# Patient Record
Sex: Female | Born: 1965
Health system: Southern US, Community
[De-identification: ages and names within clinical notes are randomized; demographics above are authoritative.]

## PROBLEM LIST (undated history)

## (undated) DIAGNOSIS — Z87442 Personal history of urinary calculi: Secondary | ICD-10-CM

## (undated) HISTORY — PX: CYSTECTOMY: SUR359

## (undated) HISTORY — PX: ABDOMINAL HYSTERECTOMY: SHX81

## (undated) HISTORY — PX: TUBAL LIGATION: SHX77

## (undated) HISTORY — PX: ANTERIOR CRUCIATE LIGAMENT REPAIR: SHX115

---

## 2001-02-09 ENCOUNTER — Emergency Department (HOSPITAL_COMMUNITY): Admission: EM | Admit: 2001-02-09 | Discharge: 2001-02-09 | Payer: Self-pay | Admitting: Emergency Medicine

## 2001-02-09 ENCOUNTER — Encounter: Payer: Self-pay | Admitting: Emergency Medicine

## 2001-02-10 ENCOUNTER — Encounter: Payer: Self-pay | Admitting: Family Medicine

## 2001-02-10 ENCOUNTER — Ambulatory Visit (HOSPITAL_COMMUNITY): Admission: RE | Admit: 2001-02-10 | Discharge: 2001-02-10 | Payer: Self-pay | Admitting: Family Medicine

## 2001-07-01 ENCOUNTER — Observation Stay (HOSPITAL_COMMUNITY): Admission: RE | Admit: 2001-07-01 | Discharge: 2001-07-02 | Payer: Self-pay | Admitting: Urology

## 2009-07-05 ENCOUNTER — Emergency Department (HOSPITAL_COMMUNITY): Admission: EM | Admit: 2009-07-05 | Discharge: 2009-07-05 | Payer: Self-pay | Admitting: Emergency Medicine

## 2009-07-06 ENCOUNTER — Emergency Department (HOSPITAL_COMMUNITY): Admission: EM | Admit: 2009-07-06 | Discharge: 2009-07-06 | Payer: Self-pay | Admitting: Emergency Medicine

## 2010-12-06 LAB — CBC
HCT: 30.4 % — ABNORMAL LOW (ref 36.0–46.0)
Hemoglobin: 10.4 g/dL — ABNORMAL LOW (ref 12.0–15.0)
MCHC: 34.3 g/dL (ref 30.0–36.0)
MCV: 101.3 fL — ABNORMAL HIGH (ref 78.0–100.0)
Platelets: 253 10*3/uL (ref 150–400)
RBC: 2.99 MIL/uL — ABNORMAL LOW (ref 3.87–5.11)
RDW: 14.7 % (ref 11.5–15.5)
WBC: 12.3 10*3/uL — ABNORMAL HIGH (ref 4.0–10.5)

## 2010-12-06 LAB — COMPREHENSIVE METABOLIC PANEL
ALT: 15 U/L (ref 0–35)
AST: 15 U/L (ref 0–37)
Albumin: 3.2 g/dL — ABNORMAL LOW (ref 3.5–5.2)
Alkaline Phosphatase: 46 U/L (ref 39–117)
BUN: 23 mg/dL (ref 6–23)
CO2: 24 mEq/L (ref 19–32)
Calcium: 8.1 mg/dL — ABNORMAL LOW (ref 8.4–10.5)
Chloride: 110 mEq/L (ref 96–112)
Creatinine, Ser: 0.49 mg/dL (ref 0.4–1.2)
GFR calc Af Amer: >60 mL/min (ref >60)
GFR calc non Af Amer: >60 mL/min (ref >60)
Glucose, Bld: 103 mg/dL — ABNORMAL HIGH (ref 70–99)
Potassium: 3.8 mEq/L (ref 3.5–5.1)
Sodium: 138 mEq/L (ref 135–145)
Total Bilirubin: 0.2 mg/dL — ABNORMAL LOW (ref 0.3–1.2)
Total Protein: 5.2 g/dL — ABNORMAL LOW (ref 6.0–8.3)

## 2010-12-06 LAB — DIFFERENTIAL
Basophils Absolute: 0 10*3/uL (ref 0.0–0.1)
Basophils Relative: 0 % (ref 0–1)
Eosinophils Absolute: 0.2 10*3/uL (ref 0.0–0.7)
Eosinophils Relative: 2 % (ref 0–5)
Lymphocytes Relative: 9 % — ABNORMAL LOW (ref 12–46)
Lymphs Abs: 1.1 10*3/uL (ref 0.7–4.0)
Monocytes Absolute: 0.4 10*3/uL (ref 0.1–1.0)
Monocytes Relative: 3 % (ref 3–12)
Neutro Abs: 10.5 10*3/uL — ABNORMAL HIGH (ref 1.7–7.7)
Neutrophils Relative %: 85 % — ABNORMAL HIGH (ref 43–77)

## 2010-12-06 LAB — HEMOGLOBIN AND HEMATOCRIT, BLOOD
HCT: 36.7 % (ref 36.0–46.0)
Hemoglobin: 12.3 g/dL (ref 12.0–15.0)

## 2010-12-06 LAB — PROTIME-INR: Prothrombin Time: 13.4 seconds (ref 11.6–15.2)

## 2010-12-06 LAB — APTT: aPTT: 27 seconds (ref 24–37)

## 2011-01-19 NOTE — Op Note (Signed)
[facility]  Patient:    Christine Tate, Christine Tate Visit Number: [id number] MRN: [id number]          Service Type: OBV Location: 3A A308 01 Attending Physician:  Alleen Borne I Dictated by:   Alleen Borne, M.D. Proc. Date: 07/01/01 Admit Date:  07/01/2001 Discharge Date: 07/02/2001   CC:         Dr. Reuel Boom   Operative Report  PREOPERATIVE DIAGNOSES: 1. ______ injury. 2. Incontinence.  POSTOPERATIVE DIAGNOSES: 1. ______ injury. 2. Incontinence.  PROCEDURE:  Tension free vaginal tear.  ANESTHESIA:  General endotracheal.  SURGEON:  Alleen Borne, M.D.  PROCEDURE IN DETAIL:  The patient was given general endotracheal anesthesia, and placed in lithotomy position.  Usual prep and drape.  A number 20 Foley catheter is introduced into the bladder and the site of the suprapubic area at the level of the pubic tubercle on the skin is marked on both sides of the midline.  Then 10 cc. of normal saline is injected into the periurethral area through hydrodissection between the vaginal wall and the perirectal tissue. An incision about a 1/2 inch long is made base the urethra ending about 1.5 cm proximal to the urethral meatus.  Once this incision is made with scissor, blunt dissection is done in the periurethral area just enough to accommodate the tip of the trocar.  At this point the Foley catheter is removed and the Foley catheter with a trocar is introduced into the bladder and the catheter is deflected towards the right side so that I can insert the trocar on the left side of the mid urethra.  The trocar with attached blue tubing is passed at the level of the mid urethra pushed into the retropubic space.  The back of the pubic symphysis is felt and the trocar is pushed into the suprapubic area at the marked site. A small skin incision is made, the trocar is pulled out through it along with the blue tubing.  Now the catheter is removed and cystoscopy  is performed with a right angle lens.  The bladder is thoroughly inspected.  There is no foreign body in the bladder.  The tubing is outside the bladder.  I proceeded to insert the other end on the right side in the same exact fashion and further cystoscopy was performed to make sure the tubing is outside the bladder which it was.  I filled up the bladder with about 300 cc. of saline to make sure bladder is fully distended to make sure the tubing is outside the bladder.  Now the tubing is passing from left to right side going under the urethra.  A number 20 Foley catheter is introduced into the bladder and special tension free vaginal tape is attached to the left end of the blue tubing and is pushed into the vaginal incision and then followed to the other end so this end came to the right side.  The tape was resting against the urethra and there was a______ on the tape at that point.  Once the tension on the tape is adjusted I had a curved Mayo scissor in between the urethra and the tape and after making sure there is proper tension on the tape, the plastic sleeve was removed.  The scissor was removed.  The tape is lying gently against the urethra.  The redundant tape was simply cut in the suprapubic area.  The wound is irrigated and then it is closed with interrupted sutures of 3-0 Vicryl.  There is no bleeding going on.  The bladder was filled up with 200 cc. of saline and Foley catheter was removed.  I left a small dry packing in the vagina to check for any bleeding and all the instruments were removed. The patient left the operating room in satisfactory condition. Dictated by:   Alleen Borne, M.D. Attending Physician:  Alleen Borne I DD:  07/01/01 TD:  07/01/01 Job: [id number]

## 2011-01-19 NOTE — H&P (Signed)
[facility]  Patient:    Christine Tate, CASASOLA Visit Number: [id number] MRN: [id number]          Service Type: DSU Location: DAY Attending Physician:  Alleen Borne I Dictated by:   Alleen Borne, M.D. Admit Date:  07/01/2001                           History and Physical  CHIEF COMPLAINT: Stress urinary incontinence.  HISTORY OF PRESENT ILLNESS: This is a 45 year old female, referred to me by Dr. Garner Nash.  She is having stress urinary incontinence, which has gotten gradually worse over the last four years.  She has some frequency and nocturia x 1, but no dysuria genitourinary incontinence.  Cystoscopy and cystometrics were done in the office which were essentially normal.  She has considerable stress incontinence which goes away with elevation of the bladder neck.  I have recommended the patient undergo a TVT procedure (transvaginal tension-free).  I explained the procedure and its complications, especially urinary retention, no guarantees about results, and she understands and wants to proceed.  She is coming as an outpatient.  The procedure will be done as an outpatient under anesthesia.  MEDICATIONS:  1. Diflucan.  2. MiraLax.  3. Nexium.  ALLERGIES: HYDROCODONE.  REVIEW OF SYSTEMS: Unremarkable.  SOCIAL HISTORY: She does not smoke.  Drinks six-pack per one month.  PHYSICAL EXAMINATION:  GENERAL: Well-nourished, well-developed female in no acute distress.  VITAL SIGNS: Blood pressure 110/80, temperature 98.4 degrees.  NEUROLOGIC: Negative.  HEENT/NECK: Negative.  CHEST: Negative.  HEART: Regular sinus rhythm.  No murmur.  ABDOMEN: Soft, flat. Liver, spleen, and kidneys not palpable.  No CVA tenderness.  PELVIC: No adnexal mass or tenderness.  IMPRESSION: Stress urinary incontinence.  PLAN: TVT procedure under anesthesia as outpatient. Dictated by:   Alleen Borne, M.D. Attending Physician:  Alleen Borne I DD:   06/30/01 TD:  07/01/01 Job: [id number]

## 2011-04-12 ENCOUNTER — Emergency Department (HOSPITAL_COMMUNITY)
Admission: EM | Admit: 2011-04-12 | Discharge: 2011-04-12 | Disposition: A | Discharge status: Home / Self Care | Payer: 59 | Attending: Emergency Medicine | Admitting: Emergency Medicine

## 2011-04-12 ENCOUNTER — Encounter: Payer: Self-pay | Admitting: *Deleted

## 2011-04-12 DIAGNOSIS — N750 Cyst of Bartholin's gland: Secondary | ICD-10-CM | POA: Insufficient documentation

## 2011-04-12 DIAGNOSIS — Z5189 Encounter for other specified aftercare: Secondary | ICD-10-CM | POA: Insufficient documentation

## 2011-04-12 NOTE — ED Provider Notes (Signed)
Scribed for Shelda Jakes, MD, the patient was seen in room 20. This chart was scribed by Jannette Fogo. This patient's care was started at 20:50.    CSN: [id number] Arrival date & time: 04/12/2011  8:15 PM  Chief Complaint  Patient presents with  . Wound Check    HPI Christine Tate is a 45 y.o. female who presents to the Emergency Department complaining of bleeding from recent incision and drainage of bartholin's gland cyst. Patient had bartholin's gland cyst treatment today at 13:00-14:00PM by Ob/GYN Dr. Donzetta Matters in [place], [place]. The site was incised and packing was placed. After procedure, patient went home and took warm baths as instructed by the Ob/GYN physician. She noted bleeding from the incision site while in the bathtub. Currently patient reports bleeding resolved. She denies taking Coumadin or any blood thinners. There are no other associated symptoms and no other alleviating or aggravating factors.     PAST MEDICAL HISTORY:  Bartholin's gland cyst   Past Surgical History  Procedure Date  . Anterior cruciate ligament repair   . Abdominal hysterectomy   . Cystectomy    I&D of bartholin's gland cyst  MEDICATIONS:  Previous Medications   BUPROPION (WELLBUTRIN) 100 MG TABLET    Take 100 mg by mouth daily.     CETIRIZINE (ZYRTEC) 10 MG TABLET    Take 10 mg by mouth daily.     HYDROCORTISONE 1 % CREAM    Apply 1 application topically as needed. For poison oak irritation    MODAFINIL (PROVIGIL) 200 MG TABLET    Take 200 mg by mouth daily.       ALLERGIES:  Allergies as of 04/12/2011 - Review Complete 04/12/2011  Allergen Reaction Noted  . Bee venom Swelling 04/12/2011  . Hydrocodone Itching 04/12/2011  . Poison sumac extract Swelling 04/12/2011     FAMILY HISTORY:  No Pertinent Family History  SOCIAL HISTORY: Arrives alone History  Substance Use Topics  . Smoking status: Current Everyday Smoker -- 1.0 packs/day  . Smokeless tobacco: Not on file  . Alcohol Use: Yes      occationally    Review of Systems  Physical Exam  BP 135/91  Pulse 88  Temp(Src) 98.5 F (36.9 C) (Oral)  Resp 18  Ht 5\' 4"  (1.626 m)  Wt 115 lb (52.164 kg)  BMI 19.74 kg/m2  SpO2 100%  Physical Exam  Constitutional: She is oriented to person, place, and time. She appears well-developed and well-nourished.  HENT:  Head: Normocephalic and atraumatic.  Mouth/Throat: Oropharynx is clear and moist.  Eyes: Conjunctivae are normal. Pupils are equal, round, and reactive to light.  Neck: Neck supple. No thyromegaly present.  Cardiovascular: Normal rate and regular rhythm.   No murmur heard. Pulmonary/Chest: Effort normal and breath sounds normal.  Abdominal: Soft. Bowel sounds are normal. She exhibits no distension. There is no tenderness.  Genitourinary:       Right bartholin's gland cyst with a 1 cm incision and bruising around I&D site. No further pus drainage. No active bleeding. Blood clot present in cyst cavity.   Musculoskeletal: Normal range of motion. She exhibits no edema and no tenderness.  Neurological: She is alert and oriented to person, place, and time. Coordination normal.  Skin: Skin is warm and dry.    OTHER DATA REVIEWED: Nursing notes, vital signs, and past medical records reviewed.  DIAGNOSTIC STUDIES: Oxygen Saturation is 100% on room air, normal by my interpretation.    ED COURSE / COORDINATION OF  CARE: 21:00 - ED physician at bedside performing pelvic exam with ED nurse as chaperone.  21:40 - Patient advised to continue applying warm soaks and taking warm baths. Follow up with Ob/GYN Dr. Donzetta Matters in [place], [place]. Patient stable for discharge.    MDM: Differential Diagnosis:  BATHOLINS CYST I&D EARLIER TO DAY WICK CAME OUT NO SIG BLEEDING NOW. HAS FOLLOW UP.    IMPRESSION: Diagnoses that have been ruled out:  Diagnoses that are still under consideration:  Final diagnoses:    PLAN:  Discharged home  Advised to continue applying warm soaks and  taking warm baths.  Follow up with Ob/GYN Dr. Donzetta Matters in [place], [place].  The patient is to return the emergency department if there is any worsening of symptoms. I have reviewed the discharge instructions with the patient.    CONDITION ON DISCHARGE: Stable   MEDICATIONS GIVEN IN THE E.D.  Medications  modafinil (PROVIGIL) 200 MG tablet (not administered)  buPROPion (WELLBUTRIN) 100 MG tablet (not administered)  cetirizine (ZYRTEC) 10 MG tablet (not administered)  hydrocortisone 1 % cream (not administered)     DISCHARGE MEDICATIONS: New Prescriptions   No medications on file       Procedures     I personally performed the services described in this documentation, which was scribed in my presence. The recorded information has been reviewed and considered. Shelda Jakes, MD   Shelda Jakes, MD 04/12/11 [phone]

## 2011-04-12 NOTE — ED Notes (Signed)
Pt reports bleeding has slowed, but incision is still open.  States that she had procedure done at physician office today. States she was following instructions regarding soaking area for pain relief when packing fell out and she was unable to get bleeding to slow.

## 2011-04-12 NOTE — ED Notes (Signed)
EDP at bedside to evaluate pt 

## 2011-04-12 NOTE — ED Notes (Signed)
Pt was seen by her md today to have cyst removed from vaginal area and is now bleeding d/t packing coming out during a bath.

## 2011-06-22 ENCOUNTER — Other Ambulatory Visit (HOSPITAL_COMMUNITY): Payer: Self-pay | Admitting: Family Medicine

## 2011-06-22 DIAGNOSIS — R928 Other abnormal and inconclusive findings on diagnostic imaging of breast: Secondary | ICD-10-CM

## 2011-07-04 ENCOUNTER — Other Ambulatory Visit (HOSPITAL_COMMUNITY): Payer: Self-pay | Admitting: General Practice

## 2011-07-11 ENCOUNTER — Ambulatory Visit (HOSPITAL_COMMUNITY)
Admission: RE | Admit: 2011-07-11 | Discharge: 2011-07-11 | Disposition: A | Discharge status: Home / Self Care | Payer: 59 | Source: Ambulatory Visit | Attending: Family Medicine | Admitting: Family Medicine

## 2011-07-11 ENCOUNTER — Other Ambulatory Visit (HOSPITAL_COMMUNITY): Payer: Self-pay | Admitting: Family Medicine

## 2011-07-11 DIAGNOSIS — R928 Other abnormal and inconclusive findings on diagnostic imaging of breast: Secondary | ICD-10-CM

## 2011-12-31 ENCOUNTER — Other Ambulatory Visit: Payer: Self-pay | Admitting: Orthopedic Surgery

## 2011-12-31 DIAGNOSIS — M47816 Spondylosis without myelopathy or radiculopathy, lumbar region: Secondary | ICD-10-CM

## 2012-01-08 ENCOUNTER — Ambulatory Visit
Admission: RE | Admit: 2012-01-08 | Discharge: 2012-01-08 | Disposition: A | Discharge status: Home / Self Care | Payer: 59 | Source: Ambulatory Visit | Attending: Orthopedic Surgery | Admitting: Orthopedic Surgery

## 2012-01-08 DIAGNOSIS — M47816 Spondylosis without myelopathy or radiculopathy, lumbar region: Secondary | ICD-10-CM

## 2012-09-01 ENCOUNTER — Other Ambulatory Visit (HOSPITAL_COMMUNITY): Payer: Self-pay | Admitting: Family Medicine

## 2012-09-01 DIAGNOSIS — Z09 Encounter for follow-up examination after completed treatment for conditions other than malignant neoplasm: Secondary | ICD-10-CM

## 2012-09-17 ENCOUNTER — Ambulatory Visit (HOSPITAL_COMMUNITY)
Admission: RE | Admit: 2012-09-17 | Discharge: 2012-09-17 | Disposition: A | Discharge status: Home / Self Care | Payer: 59 | Source: Ambulatory Visit | Attending: Family Medicine | Admitting: Family Medicine

## 2012-09-17 ENCOUNTER — Other Ambulatory Visit (HOSPITAL_COMMUNITY): Payer: Self-pay | Admitting: Family Medicine

## 2012-09-17 DIAGNOSIS — Z09 Encounter for follow-up examination after completed treatment for conditions other than malignant neoplasm: Secondary | ICD-10-CM | POA: Insufficient documentation

## 2012-09-17 DIAGNOSIS — N63 Unspecified lump in unspecified breast: Secondary | ICD-10-CM | POA: Insufficient documentation

## 2013-01-05 ENCOUNTER — Ambulatory Visit (HOSPITAL_COMMUNITY)
Admission: RE | Admit: 2013-01-05 | Discharge: 2013-01-05 | Disposition: A | Discharge status: Home / Self Care | Payer: 59 | Source: Ambulatory Visit | Attending: Internal Medicine | Admitting: Internal Medicine

## 2013-01-05 ENCOUNTER — Other Ambulatory Visit (HOSPITAL_COMMUNITY): Payer: Self-pay | Admitting: Internal Medicine

## 2013-01-05 DIAGNOSIS — M79609 Pain in unspecified limb: Secondary | ICD-10-CM | POA: Insufficient documentation

## 2013-01-05 DIAGNOSIS — M79641 Pain in right hand: Secondary | ICD-10-CM

## 2013-01-05 DIAGNOSIS — W230XXA Caught, crushed, jammed, or pinched between moving objects, initial encounter: Secondary | ICD-10-CM | POA: Insufficient documentation

## 2013-01-05 DIAGNOSIS — S62639A Displaced fracture of distal phalanx of unspecified finger, initial encounter for closed fracture: Secondary | ICD-10-CM | POA: Insufficient documentation

## 2013-01-05 DIAGNOSIS — M7989 Other specified soft tissue disorders: Secondary | ICD-10-CM | POA: Insufficient documentation

## 2014-09-03 ENCOUNTER — Encounter (HOSPITAL_COMMUNITY): Payer: Self-pay

## 2014-09-03 ENCOUNTER — Emergency Department (HOSPITAL_COMMUNITY)
Admission: EM | Admit: 2014-09-03 | Discharge: 2014-09-03 | Disposition: A | Discharge status: Home / Self Care | Payer: 59 | Attending: Emergency Medicine | Admitting: Emergency Medicine

## 2014-09-03 DIAGNOSIS — N3090 Cystitis, unspecified without hematuria: Secondary | ICD-10-CM | POA: Diagnosis not present

## 2014-09-03 DIAGNOSIS — Z720 Tobacco use: Secondary | ICD-10-CM | POA: Insufficient documentation

## 2014-09-03 DIAGNOSIS — R300 Dysuria: Secondary | ICD-10-CM

## 2014-09-03 DIAGNOSIS — Z79899 Other long term (current) drug therapy: Secondary | ICD-10-CM | POA: Diagnosis not present

## 2014-09-03 DIAGNOSIS — Z792 Long term (current) use of antibiotics: Secondary | ICD-10-CM | POA: Diagnosis not present

## 2014-09-03 DIAGNOSIS — Z9089 Acquired absence of other organs: Secondary | ICD-10-CM | POA: Insufficient documentation

## 2014-09-03 DIAGNOSIS — Z90710 Acquired absence of both cervix and uterus: Secondary | ICD-10-CM | POA: Diagnosis not present

## 2014-09-03 LAB — URINALYSIS, ROUTINE W REFLEX MICROSCOPIC
BILIRUBIN URINE: NEGATIVE
GLUCOSE, UA: NEGATIVE mg/dL
Ketones, ur: NEGATIVE mg/dL
Leukocytes, UA: NEGATIVE
Nitrite: NEGATIVE
PH: 7 (ref 5.0–8.0)
Protein, ur: NEGATIVE mg/dL
SPECIFIC GRAVITY, URINE: 1.01 (ref 1.005–1.030)
UROBILINOGEN UA: 0.2 mg/dL (ref 0.0–1.0)

## 2014-09-03 LAB — URINE MICROSCOPIC-ADD ON

## 2014-09-03 MED ORDER — PHENAZOPYRIDINE HCL 200 MG PO TABS: 200.0000 mg | ORAL_TABLET | Freq: Three times a day (TID) | ORAL | Status: DC

## 2014-09-03 MED ORDER — PHENAZOPYRIDINE HCL 100 MG PO TABS
100.0000 mg | ORAL_TABLET | Freq: Once | ORAL | Status: AC
  Administered 2014-09-03: 100 mg via ORAL
  Filled 2014-09-03: qty 1

## 2014-09-03 MED ORDER — CEPHALEXIN 500 MG PO CAPS: 500.0000 mg | ORAL_CAPSULE | Freq: Three times a day (TID) | ORAL | Status: DC

## 2014-09-03 MED ORDER — IBUPROFEN 800 MG PO TABS
800.0000 mg | ORAL_TABLET | Freq: Once | ORAL | Status: AC
  Administered 2014-09-03: 800 mg via ORAL
  Filled 2014-09-03: qty 1

## 2014-09-03 MED ORDER — CEPHALEXIN 500 MG PO CAPS
500.0000 mg | ORAL_CAPSULE | Freq: Once | ORAL | Status: AC
  Administered 2014-09-03: 500 mg via ORAL
  Filled 2014-09-03: qty 1

## 2014-09-03 MED ORDER — OXYCODONE-ACETAMINOPHEN 5-325 MG PO TABS: 1.0000 | ORAL_TABLET | ORAL | Status: DC | PRN

## 2014-09-03 NOTE — ED Notes (Signed)
Patient verbalizes understanding of discharge instructions, prescription medications, home care and follow up care if needed. Patient ambulatory out of department at this time. 

## 2014-09-03 NOTE — ED Notes (Signed)
Frequency and urgency with urination

## 2014-09-03 NOTE — ED Provider Notes (Signed)
CSN: [id number]     Arrival date & time 09/03/14  0531 History   First MD Initiated Contact with Patient 09/03/14 [phone]     Chief Complaint  Patient presents with  . Dysuria      Patient is a 49 y.o. female presenting with dysuria. The history is provided by the patient.  Dysuria Pain quality:  Burning Pain severity:  Moderate Onset quality:  Gradual Duration:  12 hours Timing:  Intermittent Progression:  Worsening Chronicity:  New Recent urinary tract infections: no   Relieved by:  None tried Urinary symptoms: frequent urination   Associated symptoms: no fever and no vomiting   pt reports that she started having dysuria yesterday.  Soon after she developed low back pain No fever/vomiting No h/o kidney stones She had otherwise been well and at her baseline   PMH - none  Past Surgical History  Procedure Laterality Date  . Anterior cruciate ligament repair    . Abdominal hysterectomy    . Cystectomy     No family history on file. History  Substance Use Topics  . Smoking status: Current Every Day Smoker -- 1.00 packs/day  . Smokeless tobacco: Not on file  . Alcohol Use: Yes     Comment: occationally   OB History    No data available     Review of Systems  Constitutional: Negative for fever.  Gastrointestinal: Negative for vomiting.  Genitourinary: Positive for dysuria.      Allergies  Bee venom; Hydrocodone; and Poison sumac extract  Home Medications   Prior to Admission medications   Medication Sig Start Date End Date Taking? Authorizing Provider  modafinil (PROVIGIL) 200 MG tablet Take 200 mg by mouth daily.     Yes Historical Provider, MD  buPROPion (WELLBUTRIN) 100 MG tablet Take 100 mg by mouth daily.      Historical Provider, MD  cephALEXin (KEFLEX) 500 MG capsule Take 1 capsule (500 mg total) by mouth 3 (three) times daily. 09/03/14   Joya Gaskins, MD  oxyCODONE-acetaminophen (PERCOCET/ROXICET) 5-325 MG per tablet Take 1 tablet by mouth every 4  (four) hours as needed for severe pain. 09/03/14   Joya Gaskins, MD  phenazopyridine (PYRIDIUM) 200 MG tablet Take 1 tablet (200 mg total) by mouth 3 (three) times daily. 09/03/14   Joya Gaskins, MD   BP 117/80 mmHg  Pulse 87  Temp(Src) 98.7 F (37.1 C) (Oral)  Resp 16  Ht  (1.626 m)  Wt 117 lb (53.071 kg)  BMI 20.07 kg/m2  SpO2 100% Physical Exam CONSTITUTIONAL: Well developed/well nourished HEAD: Normocephalic/atraumatic EYES: EOMI ENMT: Mucous membranes moist NECK: supple no meningeal signs CV: S1/S2 noted, no murmurs/rubs/gallops noted LUNGS: Lungs are clear to auscultation bilaterally, no apparent distress ABDOMEN: soft, mild suprapubic tenderness, no rebound or guarding, bowel sounds noted throughout abdomen GU:no cva tenderness NEURO: Pt is awake/alert/appropriate, moves all extremitiesx4.  No facial droop.   EXTREMITIES: pulses normal/equal, full ROM SKIN: warm, color normal PSYCH: no abnormalities of mood noted, alert and oriented to situation  ED Course  Procedures  Labs Review Labs Reviewed  URINALYSIS, ROUTINE W REFLEX MICROSCOPIC - Abnormal; Notable for the following:    Hgb urine dipstick LARGE (*)    All other components within normal limits  URINE MICROSCOPIC-ADD ON    Medications  cephALEXin (KEFLEX) capsule 500 mg (500 mg Oral Given 09/03/14 0644)  ibuprofen (ADVIL,MOTRIN) tablet 800 mg (800 mg Oral Given 09/03/14 0644)  phenazopyridine (PYRIDIUM) tablet 100 mg (  100 mg Oral Given 09/03/14 0644)     Pt well appearing She does have hematuria, which could represent kidney stone but history more c/w cystitis I offered Ct imaging to evaluate for any ureterolithiasis but pt declines and will start with antibiotics/percocet We discussed strict return precautions   MDM   Final diagnoses:  Dysuria  Cystitis    Nursing notes including past medical history and social history reviewed and considered in documentation Labs/vital reviewed myself and  considered during evaluation     Joya Gaskins, MD 09/03/14 [phone]

## 2016-02-27 ENCOUNTER — Other Ambulatory Visit: Payer: Self-pay | Admitting: Neurology

## 2016-02-27 ENCOUNTER — Other Ambulatory Visit (HOSPITAL_COMMUNITY): Payer: Self-pay | Admitting: Neurology

## 2016-02-27 ENCOUNTER — Ambulatory Visit (HOSPITAL_COMMUNITY)
Admission: RE | Admit: 2016-02-27 | Discharge: 2016-02-27 | Disposition: A | Discharge status: Home / Self Care | Payer: 59 | Source: Ambulatory Visit | Attending: Neurology | Admitting: Neurology

## 2016-02-27 DIAGNOSIS — M5440 Lumbago with sciatica, unspecified side: Secondary | ICD-10-CM

## 2016-02-27 DIAGNOSIS — M545 Low back pain: Principal | ICD-10-CM

## 2016-02-27 DIAGNOSIS — G8929 Other chronic pain: Secondary | ICD-10-CM

## 2016-03-09 ENCOUNTER — Other Ambulatory Visit: Payer: Self-pay | Admitting: Neurology

## 2016-03-09 ENCOUNTER — Ambulatory Visit
Admission: RE | Admit: 2016-03-09 | Discharge: 2016-03-09 | Disposition: A | Discharge status: Home / Self Care | Payer: 59 | Source: Ambulatory Visit | Attending: Neurology | Admitting: Neurology

## 2016-03-09 DIAGNOSIS — M545 Low back pain, unspecified: Secondary | ICD-10-CM

## 2016-03-09 DIAGNOSIS — G8929 Other chronic pain: Secondary | ICD-10-CM

## 2016-03-09 NOTE — Discharge Instructions (Signed)

## 2016-03-14 ENCOUNTER — Telehealth: Payer: Self-pay

## 2016-03-14 NOTE — Telephone Encounter (Signed)
I called patient to see how she felt after medial branch blocks done Friday, March 09, 2016.  She stated she felt great for a while but that the pain came back and she's hurting now.  I explained that Dr. Benard Rinkurnes used only Sensorcaine, no steroid, to test the sites for future RF ablation.  She is scheduled for this procedure on Tuesday, March 27, 2016 at 8:00, arriving at 7:15.  Dr. Benard Rinkurnes aware.  Donell SievertJeanne Tahir Blank, RN

## 2016-03-27 ENCOUNTER — Ambulatory Visit
Admission: RE | Admit: 2016-03-27 | Discharge: 2016-03-27 | Disposition: A | Discharge status: Home / Self Care | Payer: 59 | Source: Ambulatory Visit | Attending: Neurology | Admitting: Neurology

## 2016-03-27 ENCOUNTER — Other Ambulatory Visit: Payer: Self-pay | Admitting: Neurology

## 2016-03-27 VITALS — BP 101/68 | HR 61 | Temp 97.6°F | Resp 16

## 2016-03-27 DIAGNOSIS — M545 Low back pain, unspecified: Secondary | ICD-10-CM

## 2016-03-27 DIAGNOSIS — G8929 Other chronic pain: Secondary | ICD-10-CM

## 2016-03-27 MED ORDER — METHYLPREDNISOLONE ACETATE 40 MG/ML INJ SUSP (RADIOLOG
120.0000 mg | Freq: Once | INTRAMUSCULAR | Status: AC
  Administered 2016-03-27: 120 mg via INTRALESIONAL

## 2016-03-27 MED ORDER — FENTANYL CITRATE (PF) 100 MCG/2ML IJ SOLN: 25.0000 ug | INTRAMUSCULAR | Status: DC | PRN

## 2016-03-27 MED ORDER — KETOROLAC TROMETHAMINE 30 MG/ML IJ SOLN
30.0000 mg | Freq: Once | INTRAMUSCULAR | Status: AC
  Administered 2016-03-27: 30 mg via INTRAVENOUS

## 2016-03-27 MED ORDER — SODIUM CHLORIDE 0.9 % IV SOLN
Freq: Once | INTRAVENOUS | Status: AC
  Administered 2016-03-27: 08:00:00 via INTRAVENOUS

## 2016-03-27 MED ORDER — MIDAZOLAM HCL 2 MG/2ML IJ SOLN
1.0000 mg | INTRAMUSCULAR | Status: DC | PRN
Start: 2016-03-27 — End: 2016-03-28
  Administered 2016-03-27: 0.5 mg via INTRAVENOUS

## 2016-03-27 NOTE — Discharge Instructions (Signed)
Radio Frequency Ablation Post Procedure Discharge Instructions ° °1. May resume a regular diet and any medications that you routinely take (including pain medications). °2. No driving day of procedure. °3. Upon discharge go home and rest for at least 4 hours.  May use an ice pack as needed to injection sites on back. °4. Remove bandades later, today. ° ° ° °Please contact our office at [phone] for the following symptoms: ° °· Fever greater than 100 degrees °· Increased swelling, pain, or redness at injection site. ° ° °Thank you for visiting [facility]. °

## 2016-03-27 NOTE — Progress Notes (Signed)
Pt had a vasovagal response that responded well to NS bolus of 300 cc's. Dr. Benard Rink in to speak to pt and we will proceed with procedure.

## 2016-05-10 ENCOUNTER — Encounter (INDEPENDENT_AMBULATORY_CARE_PROVIDER_SITE_OTHER): Payer: Self-pay | Admitting: *Deleted

## 2016-06-22 ENCOUNTER — Ambulatory Visit (INDEPENDENT_AMBULATORY_CARE_PROVIDER_SITE_OTHER): Payer: 59 | Admitting: Urology

## 2016-06-22 DIAGNOSIS — N393 Stress incontinence (female) (male): Secondary | ICD-10-CM | POA: Diagnosis not present

## 2016-06-22 DIAGNOSIS — N952 Postmenopausal atrophic vaginitis: Secondary | ICD-10-CM

## 2016-07-31 ENCOUNTER — Telehealth (HOSPITAL_COMMUNITY): Payer: Self-pay | Admitting: Physical Therapy

## 2016-07-31 ENCOUNTER — Ambulatory Visit (HOSPITAL_COMMUNITY): Payer: 59 | Attending: Urology | Admitting: Physical Therapy

## 2016-07-31 DIAGNOSIS — N393 Stress incontinence (female) (male): Secondary | ICD-10-CM | POA: Diagnosis present

## 2016-07-31 DIAGNOSIS — M6281 Muscle weakness (generalized): Secondary | ICD-10-CM | POA: Diagnosis present

## 2016-07-31 NOTE — Telephone Encounter (Signed)
Gavin PoundDeborah wanted to verify when we got the referral sent to our office. After looking in epic the date was 07/06/2016. Patient was evaulated today and the pt only wants to make one visit at a time. NF

## 2016-07-31 NOTE — Progress Notes (Signed)
   07/31/16 0001  Pelvic Floor Special Questions  Are you Pregnant or attempting pregnancy? N  Prior Pregnancies Yes  Number of Pregnancies 3  Number of Vaginal Deliveries 3  Any difficulty with labor and deliveries Y  Episiotomy Performed y  Currently Sexually Active No  Urinary Leakage Yes  How often 3  Pad use 1  Activities that cause leaking Coughing;Sneezing;Laughing;Lifting;Running;Exercising  Urinary urgency N  Falling out feeling (prolapse) No  Goals  Incontinence Goals Patient will be independent with completion of home exercise program for pelvic floor exercise, progressing to standing for all ADL's/activities in 4 weeks.;Patient will be independent with self-management techniques including: pelvic floor exercise, urge control techniques, behavioral modifications (reducing caffeine intake) and bladder retraining in 12 weeks;Patient will not have leaking with stress induced activities such as coughing, sneezing, laughing, etc in 12 weeks.

## 2016-07-31 NOTE — Therapy (Signed)
[facility] [address] [place], [place], [address] Phone: [phone]   Fax:  [phone]  Physical Therapy Evaluation  Patient Details  Name: Christine Tate MRN: [id number] Date of Birth: October 09, 1965 Referring Provider: Bjorn Pippin  Encounter Date: 07/31/2016      PT End of Session - 07/31/16 1339    Visit Number 1   Number of Visits 4   Date for PT Re-Evaluation 08/30/16   Authorization Type [employer]   PT Start Time 1304   PT Stop Time 1333   PT Time Calculation (min) 29 min   Activity Tolerance Patient tolerated treatment well   Behavior During Therapy [facility] for tasks assessed/performed      No past medical history on file.  Past Surgical History:  Procedure Laterality Date  . ABDOMINAL HYSTERECTOMY    . ANTERIOR CRUCIATE LIGAMENT REPAIR    . CYSTECTOMY      There were no vitals filed for this visit.       Subjective Assessment - 07/31/16 1309    Subjective Christine Tate states that she has daytime leakage 1-3 tiems a day.  She is more apt to void when she is exercising, sneezing or coufing.  She wears one pad a day.     Pertinent History hysterectomy 2002 with sling.     Currently in Pain? No/denies            [facility] PT Assessment - 07/31/16 0001      Assessment   Medical Diagnosis Pelvic incontinence   Referring Provider Bjorn Pippin   Onset Date/Surgical Date 03/30/16   Next MD Visit unknown   Prior Therapy none     Precautions   Precautions None     Restrictions   Weight Bearing Restrictions No     Balance Screen   Has the patient fallen in the past 6 months No   Has the patient had a decrease in activity level because of a fear of falling?  No   Is the patient reluctant to leave their home because of a fear of falling?  No     Prior Function   Level of Independence Independent     Cognition   Overall Cognitive Status Within Functional Limits for tasks assessed     ROM / Strength   AROM / PROM /  Strength Strength  decreased lower abdominal musculature.                  Pelvic Floor Special Questions - 07/31/16 0001    Are you Pregnant or attempting pregnancy? No   Prior Pregnancies Yes   Number of Pregnancies 3   Number of Vaginal Deliveries 3   Any difficulty with labor and deliveries Yes   Episiotomy Performed Yes   Currently Sexually Active No   Urinary Leakage Yes   How often 3   Pad use 1   Activities that cause leaking Coughing;Sneezing;Laughing;Lifting;Running;Exercising   Urinary urgency No   Falling out feeling (prolapse) No   Incontinence Goals Patient will be independent with completion of home exercise program for pelvic floor exercise, progressing to standing for all ADL's/activities in 4 weeks.;Patient will be independent with self-management techniques including: pelvic floor exercise, urge control techniques, behavioral modifications (reducing caffeine intake) and bladder retraining in 12 weeks;Patient will not have leaking with stress induced activities such as coughing, sneezing, laughing, etc in 12 weeks.          [facility] Adult PT Treatment/Exercise - 07/31/16  0001      Posture/Postural Control   Posture/Postural Control No significant limitations     Exercises   Exercises Lumbar     Lumbar Exercises: Supine   AB Set Limitations 6 pt abdominal x 3 each    Other Supine Lumbar Exercises long hold kegal 20 seconds rest 40 x 5; marble pelvic to belly button x 10; fig leaf,(pelvic to tailbone x 5)                 PT Education - 07/31/16 1338    Education provided Yes   Education Details pelvic floor and abdominal strengthening   Person(s) Educated Patient   Methods Explanation;Handout   Comprehension Verbalized understanding;Returned demonstration          PT Short Term Goals - 07/31/16 1345      PT SHORT TERM GOAL #1   Title Pt to only be having one accident a day   Time 2   Period Weeks   Status New     PT SHORT TERM  GOAL #2   Title Pt to be aware of the importance lower abdominal strength has in pelvic health.    Time 2   Period Weeks   Status New           PT Long Term Goals - 07/31/16 1346      PT LONG TERM GOAL #1   Title Pt to be completing advance HEP to strengthen pelvic and abdominal mm to a point where she is no longer having any leakage.    Time 4   Period Weeks   Status New     PT LONG TERM GOAL #2   Title Pt to state that she has stopped using a pad during the day    Time 4   Period Weeks   Status New               Plan - 07/31/16 1340    Clinical Impression Statement Christine Tate is a 50 yo female who has been referred for urinary incontinence.  Examination demonstrates stress and not urge incontinence.  She is having leakage approximately 3-4 times a day but that varies based on what she does througout her day.  Examination demonstrates weakened lower abdominal and pelvic floor mm causing urinary incontinence.  She will benefit from skilled physical therapy for exercises to address these weakness and return her to normal urinary continence.    Rehab Potential Good   PT Frequency 1x / week   PT Duration 4 weeks   PT Treatment/Interventions ADLs/Self Care Home Management;Patient/family education;Therapeutic exercise;Therapeutic activities   PT Next Visit Plan Begin heelslides with leg off of floor keeping stabilization,sidelying abduction and quadriped single leg rais   PT Home Exercise Plan 6 pt abdominal, marble roll, fig leaf flossing and kegal    Consulted and Agree with Plan of Care Patient      Patient will benefit from skilled therapeutic intervention in order to improve the following deficits and impairments:  Decreased strength, Other (comment)  Visit Diagnosis: Stress incontinence of urine - Plan: PT plan of care cert/re-cert  Muscle weakness (generalized) - Plan: PT plan of care cert/re-cert     Problem List There are no active problems to display for  this patient.  Virgina OrganCynthia Russell, PT CLT [phone] 07/31/2016, 1:51 PM  [facility] [address] [place], [place], [address] Phone: [phone]   Fax:  [phone]  Name: Christine Tate MRN:  [id number] Date of Birth: 1965/12/07

## 2016-08-06 ENCOUNTER — Telehealth (HOSPITAL_COMMUNITY): Payer: Self-pay | Admitting: Family Medicine

## 2016-08-06 NOTE — Telephone Encounter (Signed)
08/06/16 pt cx but no reason was given... When I asked if she wanted to reschedule she said that she would call back

## 2016-08-07 ENCOUNTER — Ambulatory Visit (HOSPITAL_COMMUNITY): Payer: 59 | Admitting: Physical Therapy

## 2016-10-11 DIAGNOSIS — G4726 Circadian rhythm sleep disorder, shift work type: Secondary | ICD-10-CM | POA: Diagnosis not present

## 2016-12-04 DIAGNOSIS — L659 Nonscarring hair loss, unspecified: Secondary | ICD-10-CM | POA: Diagnosis not present

## 2016-12-04 DIAGNOSIS — G4726 Circadian rhythm sleep disorder, shift work type: Secondary | ICD-10-CM | POA: Diagnosis not present

## 2016-12-17 DIAGNOSIS — L638 Other alopecia areata: Secondary | ICD-10-CM | POA: Diagnosis not present

## 2016-12-24 DIAGNOSIS — L659 Nonscarring hair loss, unspecified: Secondary | ICD-10-CM | POA: Diagnosis not present

## 2016-12-24 DIAGNOSIS — Z6821 Body mass index (BMI) 21.0-21.9, adult: Secondary | ICD-10-CM | POA: Diagnosis not present

## 2016-12-27 DIAGNOSIS — L638 Other alopecia areata: Secondary | ICD-10-CM | POA: Diagnosis not present

## 2016-12-27 DIAGNOSIS — Z6820 Body mass index (BMI) 20.0-20.9, adult: Secondary | ICD-10-CM | POA: Diagnosis not present

## 2017-01-17 DIAGNOSIS — Z6820 Body mass index (BMI) 20.0-20.9, adult: Secondary | ICD-10-CM | POA: Diagnosis not present

## 2017-01-24 DIAGNOSIS — Z1211 Encounter for screening for malignant neoplasm of colon: Secondary | ICD-10-CM | POA: Diagnosis not present

## 2017-02-21 DIAGNOSIS — Z6820 Body mass index (BMI) 20.0-20.9, adult: Secondary | ICD-10-CM | POA: Diagnosis not present

## 2017-04-15 DIAGNOSIS — G4726 Circadian rhythm sleep disorder, shift work type: Secondary | ICD-10-CM | POA: Diagnosis not present

## 2017-04-15 DIAGNOSIS — Z6821 Body mass index (BMI) 21.0-21.9, adult: Secondary | ICD-10-CM | POA: Diagnosis not present

## 2017-05-31 DIAGNOSIS — L659 Nonscarring hair loss, unspecified: Secondary | ICD-10-CM | POA: Diagnosis not present

## 2017-05-31 DIAGNOSIS — Z1389 Encounter for screening for other disorder: Secondary | ICD-10-CM | POA: Diagnosis not present

## 2017-05-31 DIAGNOSIS — Z6821 Body mass index (BMI) 21.0-21.9, adult: Secondary | ICD-10-CM | POA: Diagnosis not present

## 2017-05-31 DIAGNOSIS — Z0001 Encounter for general adult medical examination with abnormal findings: Secondary | ICD-10-CM | POA: Diagnosis not present

## 2017-07-04 DIAGNOSIS — Z1231 Encounter for screening mammogram for malignant neoplasm of breast: Secondary | ICD-10-CM | POA: Diagnosis not present

## 2017-08-06 ENCOUNTER — Other Ambulatory Visit (HOSPITAL_COMMUNITY): Payer: Self-pay | Admitting: Physician Assistant

## 2017-08-13 ENCOUNTER — Other Ambulatory Visit: Payer: Self-pay

## 2017-08-13 ENCOUNTER — Emergency Department (HOSPITAL_COMMUNITY)
Admission: EM | Admit: 2017-08-13 | Discharge: 2017-08-13 | Disposition: A | Discharge status: Home / Self Care | Payer: 59 | Attending: Emergency Medicine | Admitting: Emergency Medicine

## 2017-08-13 ENCOUNTER — Encounter (HOSPITAL_COMMUNITY): Payer: Self-pay | Admitting: *Deleted

## 2017-08-13 DIAGNOSIS — J029 Acute pharyngitis, unspecified: Secondary | ICD-10-CM | POA: Diagnosis present

## 2017-08-13 DIAGNOSIS — B9789 Other viral agents as the cause of diseases classified elsewhere: Secondary | ICD-10-CM | POA: Diagnosis not present

## 2017-08-13 DIAGNOSIS — Z79899 Other long term (current) drug therapy: Secondary | ICD-10-CM | POA: Diagnosis not present

## 2017-08-13 DIAGNOSIS — J069 Acute upper respiratory infection, unspecified: Secondary | ICD-10-CM | POA: Diagnosis not present

## 2017-08-13 DIAGNOSIS — F17200 Nicotine dependence, unspecified, uncomplicated: Secondary | ICD-10-CM | POA: Diagnosis not present

## 2017-08-13 DIAGNOSIS — H6591 Unspecified nonsuppurative otitis media, right ear: Secondary | ICD-10-CM | POA: Insufficient documentation

## 2017-08-13 LAB — RAPID STREP SCREEN (MED CTR MEBANE ONLY): Streptococcus, Group A Screen (Direct): NEGATIVE

## 2017-08-13 MED ORDER — DM-GUAIFENESIN ER 30-600 MG PO TB12
1.0000 | ORAL_TABLET | Freq: Two times a day (BID) | ORAL | Status: DC
  Administered 2017-08-13: 1 via ORAL
  Filled 2017-08-13: qty 1

## 2017-08-13 MED ORDER — AMOXICILLIN 500 MG PO CAPS: 500.0000 mg | ORAL_CAPSULE | Freq: Three times a day (TID) | ORAL | 0 refills | Status: DC

## 2017-08-13 MED ORDER — AMOXICILLIN 250 MG PO CAPS
500.0000 mg | ORAL_CAPSULE | Freq: Once | ORAL | Status: AC
  Administered 2017-08-13: 500 mg via ORAL
  Filled 2017-08-13: qty 2

## 2017-08-13 NOTE — Discharge Instructions (Signed)
Drink plenty of fluids. Take mucinex DM OTC for cough, take ibuprofen 600 mg and/or acetaminophen for fever or pain. Take the antibiotics until gone. Recheck if you get a high fever, struggle to breathe or feel worse.

## 2017-08-13 NOTE — ED Provider Notes (Signed)
[facility] EMERGENCY DEPARTMENT Provider Note   CSN: [id number] Arrival date & time: 08/13/17  0032  Time seen 12:51 PM    History   Chief Complaint Chief Complaint  Patient presents with  . URI    HPI Christine MortonMary S Tate is a 51 y.o. female.  HPI patient states she started with a sore throat on December 6.  It has persisted.  She started having bilateral ear pain on the eighth.  She states the right hurts more than the left.  She complains of chills but does not know if she has had a fever.  She started having a cough today.  She has a stuffy nose with clear rhinorrhea and has had sneezing.  She states her chest feels tight like when you have a cold.  She denies wheezing or shortness of breath.  She states her son is had a stuffy nose but otherwise she has not been around anybody who is sick.  PCP Avis Tate, Samantha J, PA-C   History reviewed. No pertinent past medical history.  There are no active problems to display for this patient.   Past Surgical History:  Procedure Laterality Date  . ABDOMINAL HYSTERECTOMY    . ANTERIOR CRUCIATE LIGAMENT REPAIR    . CYSTECTOMY    . TUBAL LIGATION      OB History    No data available       Home Medications    Prior to Admission medications   Medication Sig Start Date End Date Taking? Authorizing Provider  amoxicillin (AMOXIL) 500 MG capsule Take 1 capsule (500 mg total) by mouth 3 (three) times daily. 08/13/17   Devoria AlbeKnapp, Elveria Lauderbaugh, MD  buPROPion (WELLBUTRIN) 100 MG tablet Take 100 mg by mouth daily.      [provider]  cephALEXin (KEFLEX) 500 MG capsule Take 1 capsule (500 mg total) by mouth 3 (three) times daily. 09/03/14   Zadie RhineWickline, Donald, MD  modafinil (PROVIGIL) 200 MG tablet Take 200 mg by mouth daily.      [provider]  oxyCODONE-acetaminophen (PERCOCET/ROXICET) 5-325 MG per tablet Take 1 tablet by mouth every 4 (four) hours as needed for severe pain. 09/03/14   Zadie RhineWickline, Donald, MD  phenazopyridine (PYRIDIUM)  200 MG tablet Take 1 tablet (200 mg total) by mouth 3 (three) times daily. 09/03/14   Zadie RhineWickline, Donald, MD    Family History History reviewed. No pertinent family history.  Social History Social History   Tobacco Use  . Smoking status: Current Every Day Smoker    Packs/day: 1.00  . Smokeless tobacco: Never Used  Substance Use Topics  . Alcohol use: Yes    Comment: occationally  . Drug use: No  employed   Allergies   Bee venom; Hydrocodone; and Poison sumac extract   Review of Systems Review of Systems  All other systems reviewed and are negative.    Physical Exam Updated Vital Signs BP 129/80 (BP Location: Left Arm)   Pulse 90   Temp 98.9 F (37.2 C) (Oral)   Resp 18   Ht 5\' 4"  (1.626 m)   Wt 56.7 kg (125 lb)   SpO2 98%   BMI 21.46 kg/m   Vital signs normal    Physical Exam  Constitutional: She is oriented to person, place, and time. She appears well-developed and well-nourished.  Non-toxic appearance. She does not appear ill. No distress.  HENT:  Head: Normocephalic and atraumatic.  Right Ear: External ear and ear canal normal.  Left Ear: Tympanic membrane, external  ear and ear canal normal.  Nose: Nose normal. No mucosal edema or rhinorrhea.  Mouth/Throat: Uvula is midline, oropharynx is clear and moist and mucous membranes are normal. No dental abscesses or uvula swelling. No oropharyngeal exudate, posterior oropharyngeal edema or posterior oropharyngeal erythema.  She has faint redness and minimal dullness of her Rt TM, the left appears normal.   Her tonsils appear normal without redness or swelling.   Eyes: Conjunctivae and EOM are normal. Pupils are equal, round, and reactive to light.  Neck: Normal range of motion and full passive range of motion without pain. Neck supple.  Cardiovascular: Normal rate, regular rhythm and normal heart sounds. Exam reveals no gallop and no friction rub.  No murmur heard. Pulmonary/Chest: Effort normal and breath sounds  normal. No respiratory distress. She has no wheezes. She has no rhonchi. She has no rales. She exhibits no tenderness and no crepitus.  Abdominal: Soft. Normal appearance and bowel sounds are normal. She exhibits no distension. There is no tenderness. There is no rebound and no guarding.  Musculoskeletal: Normal range of motion. She exhibits no edema or tenderness.  Moves all extremities well.   Neurological: She is alert and oriented to person, place, and time. She has normal strength. No cranial nerve deficit.  Skin: Skin is warm, dry and intact. No rash noted. No erythema. No pallor.  Psychiatric: She has a normal mood and affect. Her speech is normal and behavior is normal. Her mood appears not anxious.  Nursing note and vitals reviewed.    ED Treatments / Results  Labs (all labs ordered are listed, but only abnormal results are displayed) Results for orders placed or performed during the hospital encounter of 08/13/17  Rapid strep screen  Result Value Ref Range   Streptococcus, Group A Screen (Direct) NEGATIVE NEGATIVE      EKG  EKG Interpretation None       Radiology No results found.  Procedures Procedures (including critical care time)  Medications Ordered in ED Medications  amoxicillin (AMOXIL) capsule 500 mg (not administered)  dextromethorphan-guaiFENesin (MUCINEX DM) 30-600 MG per 12 hr tablet 1 tablet (not administered)     Initial Impression / Assessment and Plan / ED Course  I have reviewed the triage vital signs and the nursing notes.  Pertinent labs & imaging results that were available during my care of the patient were reviewed by me and considered in my medical decision making (see chart for details).     Strep screen was done which was normal.  Her oropharynx appeared benign.  She does have a early otitis media of the right ear.  She was started on amoxicillin.  She can drink plenty of fluids, take Motrin or Tylenol over-the-counter for pain and  fever, and she should be rechecked if she gets a high fever, struggles to breathe, or seems worse.  Final Clinical Impressions(s) / ED Diagnoses   Final diagnoses:  Viral upper respiratory tract infection  Right non-suppurative otitis media    ED Discharge Orders        Ordered    amoxicillin (AMOXIL) 500 MG capsule  3 times daily     08/13/17 0119    OTC ibuprofen and acetaminophen, mucinex DM  Plan discharge  Devoria AlbeIva Shya Kovatch, MD, Concha PyoFACEP     Niara Bunker, MD 08/13/17 [phone]

## 2017-08-13 NOTE — ED Notes (Signed)
Pt alert & oriented x4, stable gait. Patient given discharge instructions, paperwork & prescription(s). Patient  instructed to stop at the registration desk to finish any additional paperwork. Patient verbalized understanding. Pt left department w/ no further questions. 

## 2017-08-13 NOTE — ED Triage Notes (Signed)
Pt c/o sore throat, chills, ear fullness and chest tightness x 4 days

## 2017-08-15 LAB — CULTURE, GROUP A STREP (THRC)

## 2017-08-20 ENCOUNTER — Other Ambulatory Visit (HOSPITAL_COMMUNITY): Payer: Self-pay | Admitting: Physician Assistant

## 2017-08-20 DIAGNOSIS — R928 Other abnormal and inconclusive findings on diagnostic imaging of breast: Secondary | ICD-10-CM

## 2017-08-29 ENCOUNTER — Encounter (HOSPITAL_COMMUNITY): Payer: Self-pay

## 2017-08-29 ENCOUNTER — Ambulatory Visit (HOSPITAL_COMMUNITY)
Admission: RE | Admit: 2017-08-29 | Discharge: 2017-08-29 | Disposition: A | Discharge status: Home / Self Care | Payer: 59 | Source: Ambulatory Visit | Attending: Physician Assistant | Admitting: Physician Assistant

## 2017-08-29 DIAGNOSIS — R928 Other abnormal and inconclusive findings on diagnostic imaging of breast: Secondary | ICD-10-CM

## 2017-08-29 DIAGNOSIS — N6312 Unspecified lump in the right breast, upper inner quadrant: Secondary | ICD-10-CM | POA: Diagnosis not present

## 2017-08-29 DIAGNOSIS — N6001 Solitary cyst of right breast: Secondary | ICD-10-CM | POA: Diagnosis not present

## 2017-08-29 DIAGNOSIS — R921 Mammographic calcification found on diagnostic imaging of breast: Secondary | ICD-10-CM | POA: Diagnosis not present

## 2017-09-10 ENCOUNTER — Other Ambulatory Visit: Payer: Self-pay | Admitting: Physician Assistant

## 2017-09-10 DIAGNOSIS — R921 Mammographic calcification found on diagnostic imaging of breast: Secondary | ICD-10-CM

## 2017-09-23 ENCOUNTER — Other Ambulatory Visit: Payer: Self-pay | Admitting: Physician Assistant

## 2017-09-23 DIAGNOSIS — N6310 Unspecified lump in the right breast, unspecified quadrant: Secondary | ICD-10-CM

## 2017-10-11 ENCOUNTER — Other Ambulatory Visit: Payer: Self-pay | Admitting: Physician Assistant

## 2017-10-11 ENCOUNTER — Ambulatory Visit
Admission: RE | Admit: 2017-10-11 | Discharge: 2017-10-11 | Disposition: A | Discharge status: Home / Self Care | Payer: 59 | Source: Ambulatory Visit | Attending: Physician Assistant | Admitting: Physician Assistant

## 2017-10-11 DIAGNOSIS — R921 Mammographic calcification found on diagnostic imaging of breast: Secondary | ICD-10-CM

## 2017-10-16 ENCOUNTER — Ambulatory Visit
Admission: RE | Admit: 2017-10-16 | Discharge: 2017-10-16 | Disposition: A | Discharge status: Home / Self Care | Payer: 59 | Source: Ambulatory Visit | Attending: Physician Assistant | Admitting: Physician Assistant

## 2017-10-16 DIAGNOSIS — R921 Mammographic calcification found on diagnostic imaging of breast: Secondary | ICD-10-CM | POA: Diagnosis not present

## 2017-10-16 DIAGNOSIS — N6012 Diffuse cystic mastopathy of left breast: Secondary | ICD-10-CM | POA: Diagnosis not present

## 2017-11-01 DIAGNOSIS — G4726 Circadian rhythm sleep disorder, shift work type: Secondary | ICD-10-CM | POA: Diagnosis not present

## 2017-11-01 DIAGNOSIS — Z6822 Body mass index (BMI) 22.0-22.9, adult: Secondary | ICD-10-CM | POA: Diagnosis not present

## 2017-11-04 DIAGNOSIS — H52223 Regular astigmatism, bilateral: Secondary | ICD-10-CM | POA: Diagnosis not present

## 2017-11-04 DIAGNOSIS — H5201 Hypermetropia, right eye: Secondary | ICD-10-CM | POA: Diagnosis not present

## 2017-11-04 DIAGNOSIS — H524 Presbyopia: Secondary | ICD-10-CM | POA: Diagnosis not present

## 2018-03-20 DIAGNOSIS — S161XXA Strain of muscle, fascia and tendon at neck level, initial encounter: Secondary | ICD-10-CM | POA: Diagnosis not present

## 2018-03-20 DIAGNOSIS — J029 Acute pharyngitis, unspecified: Secondary | ICD-10-CM | POA: Diagnosis not present

## 2018-03-20 DIAGNOSIS — F17210 Nicotine dependence, cigarettes, uncomplicated: Secondary | ICD-10-CM | POA: Diagnosis not present

## 2018-04-24 DIAGNOSIS — G4726 Circadian rhythm sleep disorder, shift work type: Secondary | ICD-10-CM | POA: Diagnosis not present

## 2018-04-24 DIAGNOSIS — Z6820 Body mass index (BMI) 20.0-20.9, adult: Secondary | ICD-10-CM | POA: Diagnosis not present

## 2018-05-13 ENCOUNTER — Ambulatory Visit (INDEPENDENT_AMBULATORY_CARE_PROVIDER_SITE_OTHER): Payer: Self-pay

## 2018-05-13 DIAGNOSIS — Z1211 Encounter for screening for malignant neoplasm of colon: Secondary | ICD-10-CM

## 2018-05-13 MED ORDER — NA SULFATE-K SULFATE-MG SULF 17.5-3.13-1.6 GM/177ML PO SOLN: 1.0000 | ORAL | 0 refills | Status: DC

## 2018-05-13 NOTE — Patient Instructions (Signed)
Christine Tate  [birth year] MRN: [id number]     Procedure Date: 06/25/18 Time to register: 8:45am Place to register: [facility] Procedure Time: 9:45am Scheduled provider: R. Garfield Cornea, MD    PREPARATION FOR COLONOSCOPY WITH SUPREP BOWEL PREP KIT  Note: Suprep Bowel Prep Kit is a split-dose (2day) regimen. Consumption of BOTH 6-ounce bottles is required for a complete prep.  Please notify us immediately if you are diabetic, take iron supplements, or if you are on Coumadin or any other blood thinners.                                                                                                                                                   2 DAYS BEFORE PROCEDURE:  DATE: 06/23/18   DAY: Monday Begin clear liquid diet AFTER your lunch meal. NO SOLID FOODS after this point.  1 DAY BEFORE PROCEDURE:  DATE: 06/24/18   DAY: Tuesday Continue clear liquids the entire day - NO SOLID FOOD.    At 6:00pm: Complete steps 1 through 4 below, using ONE (1) 6-ounce bottle, before going to bed. Step 1:  Pour ONE (1) 6-ounce bottle of SUPREP liquid into the mixing container.  Step 2:  Add cool drinking water to the 16 ounce line on the container and mix.  Note: Dilute the solution concentrate as directed prior to use. Step 3:  DRINK ALL the liquid in the container. Step 4:  You MUST drink an additional two (2) or more 16 ounce containers of water over the next one (1) hour.   Continue clear liquids.  DAY OF PROCEDURE:   DATE: 06/25/18  DAY: Wednesday If you take medications for your heart, blood pressure, or breathing, you may take these medications.    5 hours before your procedure at :4:45am Step 1:  Pour ONE (1) 6-ounce bottle of SUPREP liquid into the mixing container.  Step 2:  Add cool drinking water to the 16 ounce line on the container and mix.  Note: Dilute the solution concentrate as directed prior to use. Step 3:  DRINK ALL the liquid in the container. Step 4:   You MUST drink an additional two (2) or more 16 ounce containers of water over the next one (1) hour. You MUST complete the final glass of water at least 3 hours before your colonoscopy.   Nothing by mouth past : 6:45am  You may take your morning medications with sip of water unless we have instructed otherwise.    Please see below for Dietary Information.  CLEAR LIQUIDS INCLUDE:  Water Jello (NOT red in color)   Ice Popsicles (NOT red in color)   Tea (sugar ok, no milk/cream) Powdered fruit flavored drinks  Coffee (sugar ok, no milk/cream) Gatorade/ Lemonade/ Kool-Aid  (NOT red in color)   Juice: apple, white grape, white cranberry  Soft drinks  Clear bullion, consomme, broth (fat free beef/chicken/vegetable)  Carbonated beverages (any kind)  Strained chicken noodle soup Hard Candy   Remember: Clear liquids are liquids that will allow you to see your fingers on the other side of a clear glass. Be sure liquids are NOT red in color, and not cloudy, but CLEAR.  DO NOT EAT OR DRINK ANY OF THE FOLLOWING:  Dairy products of any kind   Cranberry juice Tomato juice / V8 juice   Grapefruit juice Orange juice     Red grape juice  Do not eat any solid foods, including such foods as: cereal, oatmeal, yogurt, fruits, vegetables, creamed soups, eggs, bread, crackers, pureed foods in a blender, etc.   HELPFUL HINTS FOR DRINKING PREP SOLUTION:   Make sure prep is extremely cold. Mix and refrigerate the the morning of the prep. You may also put in the freezer.   You may try mixing some Crystal Light or Country Time Lemonade if you prefer. Mix in small amounts; add more if necessary.  Try drinking through a straw  Rinse mouth with water or a mouthwash between glasses, to remove after-taste.  Try sipping on a cold beverage /ice/ popsicles between glasses of prep.  Place a piece of sugar-free hard candy in mouth between glasses.  If you become nauseated, try consuming smaller amounts, or  stretch out the time between glasses. Stop for 30-60 minutes, then slowly start back drinking.     OTHER INSTRUCTIONS  You will need a responsible adult at least 53 years of age to accompany you and drive you home. This person must remain in the waiting room during your procedure. The hospital will cancel your procedure if you do not have a responsible adult with you.   1. Wear loose fitting clothing that is easily removed. 2. Leave jewelry and other valuables at home.  3. Remove all body piercing jewelry and leave at home. 4. Total time from sign-in until discharge is approximately 2-3 hours. 5. You should go home directly after your procedure and rest. You can resume normal activities the day after your procedure. 6. The day of your procedure you should not:  Drive  Make legal decisions  Operate machinery  Drink alcohol  Return to work   You may call the office (Dept: [phone]) before 5:00pm, or page the doctor on call [phone]) after 5:00pm, for further instructions, if necessary.   Insurance Information YOU WILL NEED TO CHECK WITH YOUR INSURANCE COMPANY FOR THE BENEFITS OF COVERAGE YOU HAVE FOR THIS PROCEDURE.  UNFORTUNATELY, NOT ALL INSURANCE COMPANIES HAVE BENEFITS TO COVER ALL OR PART OF THESE TYPES OF PROCEDURES.  IT IS YOUR RESPONSIBILITY TO CHECK YOUR BENEFITS, HOWEVER, WE WILL BE GLAD TO ASSIST YOU WITH ANY CODES YOUR INSURANCE COMPANY MAY NEED.    PLEASE NOTE THAT MOST INSURANCE COMPANIES WILL NOT COVER A SCREENING COLONOSCOPY FOR PEOPLE UNDER THE AGE OF 50  IF YOU HAVE BCBS INSURANCE, YOU MAY HAVE BENEFITS FOR A SCREENING COLONOSCOPY BUT IF POLYPS ARE FOUND THE DIAGNOSIS WILL CHANGE AND THEN YOU MAY HAVE A DEDUCTIBLE THAT WILL NEED TO BE MET. SO PLEASE MAKE SURE YOU CHECK YOUR BENEFITS FOR A SCREENING COLONOSCOPY AS WELL AS A DIAGNOSTIC COLONOSCOPY.

## 2018-05-13 NOTE — Progress Notes (Addendum)
Gastroenterology Pre-Procedure Review  Request Date:05/13/18 Requesting Physician: Dwyane Luo PA Robbie Lis- no previous tcs  PATIENT REVIEW QUESTIONS: The patient responded to the following health history questions as indicated:    1. Diabetes Melitis: no 2. Joint replacements in the past 12 months: no 3. Major health problems in the past 3 months: no 4. Has an artificial valve or MVP: no 5. Has a defibrillator: no 6. Has been advised in past to take antibiotics in advance of a procedure like teeth cleaning: no 7. Family history of colon cancer: yes (distant uncle fathers side)  8. Alcohol Use: yes (occasionally) 9. History of sleep apnea: no  10. History of coronary artery or other vascular stents placed within the last 12 months: no 11. History of any prior anesthesia complications: no    MEDICATIONS & ALLERGIES:    Patient reports the following regarding taking any blood thinners:   Plavix? no Aspirin? no Coumadin? no Brilinta? no Xarelto? no Eliquis? no Pradaxa? no Savaysa? no Effient? no  Patient confirms/reports the following medications:  Current Outpatient Medications  Medication Sig Dispense Refill  . modafinil (PROVIGIL) 200 MG tablet Take 200 mg by mouth daily.       No current facility-administered medications for this visit.     Patient confirms/reports the following allergies:  Allergies  Allergen Reactions  . Bee Venom Swelling  . Hydrocodone Itching  . Poison Sumac Extract Swelling    No orders of the defined types were placed in this encounter.   AUTHORIZATION INFORMATION Primary Insurance: [facility],  [place] #: [id number] Pre-Cert / Berkley Harvey required: yes Pre-Cert / Auth #: [id number]   SCHEDULE INFORMATION: Procedure has been scheduled as follows:  Date: 06/25/18, Time: 9:45 Location: [facility] Dr[facility]  This Gastroenterology Pre-Precedure Review Form is being routed to the following provider(s): Wynne Dust NP

## 2018-05-15 NOTE — Progress Notes (Signed)
Ok to schedule.

## 2018-06-05 DIAGNOSIS — Z0001 Encounter for general adult medical examination with abnormal findings: Secondary | ICD-10-CM | POA: Diagnosis not present

## 2018-06-05 DIAGNOSIS — G4726 Circadian rhythm sleep disorder, shift work type: Secondary | ICD-10-CM | POA: Diagnosis not present

## 2018-06-05 DIAGNOSIS — Z0000 Encounter for general adult medical examination without abnormal findings: Secondary | ICD-10-CM | POA: Diagnosis not present

## 2018-06-25 ENCOUNTER — Other Ambulatory Visit: Payer: Self-pay

## 2018-06-25 ENCOUNTER — Encounter (HOSPITAL_COMMUNITY): Payer: Self-pay | Admitting: *Deleted

## 2018-06-25 ENCOUNTER — Ambulatory Visit (HOSPITAL_COMMUNITY)
Admission: RE | Admit: 2018-06-25 | Discharge: 2018-06-25 | Disposition: A | Discharge status: Home / Self Care | Payer: 59 | Source: Ambulatory Visit | Attending: Internal Medicine | Admitting: Internal Medicine

## 2018-06-25 ENCOUNTER — Encounter (HOSPITAL_COMMUNITY)
Admission: RE | Disposition: A | Discharge status: Home / Self Care | Payer: Self-pay | Source: Ambulatory Visit | Attending: Internal Medicine

## 2018-06-25 DIAGNOSIS — Z885 Allergy status to narcotic agent status: Secondary | ICD-10-CM | POA: Insufficient documentation

## 2018-06-25 DIAGNOSIS — Z79899 Other long term (current) drug therapy: Secondary | ICD-10-CM | POA: Diagnosis not present

## 2018-06-25 DIAGNOSIS — Z87442 Personal history of urinary calculi: Secondary | ICD-10-CM | POA: Insufficient documentation

## 2018-06-25 DIAGNOSIS — K621 Rectal polyp: Secondary | ICD-10-CM

## 2018-06-25 DIAGNOSIS — Z1211 Encounter for screening for malignant neoplasm of colon: Secondary | ICD-10-CM | POA: Diagnosis not present

## 2018-06-25 DIAGNOSIS — Z91030 Bee allergy status: Secondary | ICD-10-CM | POA: Diagnosis not present

## 2018-06-25 DIAGNOSIS — F17210 Nicotine dependence, cigarettes, uncomplicated: Secondary | ICD-10-CM | POA: Insufficient documentation

## 2018-06-25 HISTORY — DX: Personal history of urinary calculi: Z87.442

## 2018-06-25 HISTORY — PX: COLONOSCOPY: SHX5424

## 2018-06-25 HISTORY — PX: POLYPECTOMY: SHX5525

## 2018-06-25 SURGERY — COLONOSCOPY
Anesthesia: Moderate Sedation

## 2018-06-25 MED ORDER — SODIUM CHLORIDE 0.9 % IV SOLN
INTRAVENOUS | Status: DC
  Administered 2018-06-25: 1000 mL via INTRAVENOUS

## 2018-06-25 MED ORDER — MIDAZOLAM HCL 5 MG/5ML IJ SOLN
INTRAMUSCULAR | Status: DC | PRN
  Administered 2018-06-25 (×3): 1 mg via INTRAVENOUS

## 2018-06-25 MED ORDER — MEPERIDINE HCL 50 MG/ML IJ SOLN
INTRAMUSCULAR | Status: AC
  Filled 2018-06-25: qty 1

## 2018-06-25 MED ORDER — ONDANSETRON HCL 4 MG/2ML IJ SOLN
INTRAMUSCULAR | Status: AC
  Filled 2018-06-25: qty 2

## 2018-06-25 MED ORDER — STERILE WATER FOR IRRIGATION IR SOLN
Status: DC | PRN
  Administered 2018-06-25: 09:00:00

## 2018-06-25 MED ORDER — MIDAZOLAM HCL 5 MG/5ML IJ SOLN
INTRAMUSCULAR | Status: AC
  Filled 2018-06-25: qty 10

## 2018-06-25 MED ORDER — MEPERIDINE HCL 100 MG/ML IJ SOLN
INTRAMUSCULAR | Status: DC | PRN
  Administered 2018-06-25: 25 mg via INTRAVENOUS

## 2018-06-25 MED ORDER — ONDANSETRON HCL 4 MG/2ML IJ SOLN
INTRAMUSCULAR | Status: DC | PRN
  Administered 2018-06-25: 4 mg via INTRAVENOUS

## 2018-06-25 NOTE — Progress Notes (Signed)
Christine Tate was at [facility] on 06/25/2018 for a procedure and cannot return to work until 10:00am on 06/26/2018.

## 2018-06-25 NOTE — H&P (Signed)
$'@LOGO'J$ @   Primary Care Physician:  Jake Samples, PA-C Primary Gastroenterologist:  Dr. Gala Romney  Pre-Procedure History & Physical: HPI:  Christine Tate is a 52 y.o. female is here for a screening colonoscopy.  No bowel symptoms.  No family history of colon cancer in a first or second-degree relatives.  No prior colonoscopy.  Past Medical History:  Diagnosis Date  . History of kidney stones     Past Surgical History:  Procedure Laterality Date  . ABDOMINAL HYSTERECTOMY    . ANTERIOR CRUCIATE LIGAMENT REPAIR    . CYSTECTOMY    . TUBAL LIGATION      Prior to Admission medications   Medication Sig Start Date End Date Taking? Authorizing Provider  modafinil (PROVIGIL) 200 MG tablet Take 200 mg by mouth 2 (two) times daily.    Yes [provider]  Na Sulfate-K Sulfate-Mg Sulf (SUPREP BOWEL PREP KIT) 17.5-3.13-1.6 GM/177ML SOLN Take 1 kit by mouth as directed. 05/13/18  Yes Carlis Stable, NP    Allergies as of 05/13/2018 - Review Complete 05/13/2018  Allergen Reaction Noted  . Bee venom Swelling 04/12/2011  . Hydrocodone Itching 04/12/2011  . Poison sumac extract Swelling 04/12/2011    Family History  Problem Relation Age of Onset  . Heart Problems Mother   . Breast cancer Sister     Social History   Socioeconomic History  . Marital status: Married    Spouse name: Not on file  . Number of children: Not on file  . Years of education: Not on file  . Highest education level: Not on file  Occupational History  . Not on file  Social Needs  . Financial resource strain: Not on file  . Food insecurity:    Worry: Not on file    Inability: Not on file  . Transportation needs:    Medical: Not on file    Non-medical: Not on file  Tobacco Use  . Smoking status: Current Every Day Smoker    Packs/day: 1.00  . Smokeless tobacco: Never Used  Substance and Sexual Activity  . Alcohol use: Yes    Comment: occationally  . Drug use: No  . Sexual activity: Yes   Lifestyle  . Physical activity:    Days per week: Not on file    Minutes per session: Not on file  . Stress: Not on file  Relationships  . Social connections:    Talks on phone: Not on file    Gets together: Not on file    Attends religious service: Not on file    Active member of club or organization: Not on file    Attends meetings of clubs or organizations: Not on file    Relationship status: Not on file  . Intimate partner violence:    Fear of current or ex partner: Not on file    Emotionally abused: Not on file    Physically abused: Not on file    Forced sexual activity: Not on file  Other Topics Concern  . Not on file  Social History Narrative  . Not on file    Review of Systems: See HPI, otherwise negative ROS  Physical Exam: BP 131/83   Pulse (!) 54   Temp 98.1 F (36.7 C) (Oral)   Resp 14   Ht '5\' 4"'$  (1.626 m)   Wt 56.7 kg   SpO2 100%   BMI 21.46 kg/m  General:   Alert,  Well-developed, well-nourished, pleasant and cooperative in NAD Lungs:  Clear throughout to auscultation.   No wheezes, crackles, or rhonchi. No acute distress. Heart:  Regular rate and rhythm; no murmurs, clicks, rubs,  or gallops. Abdomen:  Soft, nontender and nondistended. No masses, hepatosplenomegaly or hernias noted. Normal bowel sounds, without guarding, and without rebound.    Impression/Plan: Christine Tate is now here to undergo a screening colonoscopy.  First ever average risk screening examination.  Risks, benefits, limitations, imponderables and alternatives regarding colonoscopy have been reviewed with the patient. Questions have been answered. All parties agreeable.     Notice:  This dictation was prepared with Dragon dictation along with smaller phrase technology. Any transcriptional errors that result from this process are unintentional and may not be corrected upon review.

## 2018-06-25 NOTE — Op Note (Signed)
[facility] Patient Name: Christine Tate Procedure Date: 06/25/2018 9:19 AM MRN: [id number] Date of Birth: 23-Aug-1966 Attending MD: Gennette Pac , MD CSN: [id number] Age: 52 Admit Type: Outpatient Procedure:                Colonoscopy Indications:              Screening for colorectal malignant neoplasm Providers:                Gennette Pac, MD, Criselda Peaches. Mathis Fare RN, RN,                            Edythe Clarity, Technician Referring MD:              Medicines:                Meperidine 25 mg IV, Midazolam 3 mg IV, Ondansetron                            4 mg IV Complications:            No immediate complications. Estimated Blood Loss:     Estimated blood loss was minimal. Procedure:                Pre-Anesthesia Assessment:                           - Prior to the procedure, a History and Physical                            was performed, and patient medications and                            allergies were reviewed. The patient's tolerance of                            previous anesthesia was also reviewed. The risks                            and benefits of the procedure and the sedation                            options and risks were discussed with the patient.                            All questions were answered, and informed consent                            was obtained. Prior Anticoagulants: The patient has                            taken no previous anticoagulant or antiplatelet                            agents. ASA Grade Assessment: II - A patient with  mild systemic disease. After reviewing the risks                            and benefits, the patient was deemed in                            satisfactory condition to undergo the procedure.                           After obtaining informed consent, the colonoscope                            was passed under direct vision. Throughout the                            procedure,  the patient's blood pressure, pulse, and                            oxygen saturations were monitored continuously. The                            CF-[id number] ([id number]) scope was introduced through                            the anus and advanced to the the cecum, identified                            by appendiceal orifice and ileocecal valve. The                            colonoscopy was performed without difficulty. The                            patient tolerated the procedure well. The quality                            of the bowel preparation was adequate. The                            ileocecal valve, appendiceal orifice, and rectum                            were photographed. The entire colon was well                            visualized. Scope In: 9:34:45 AM Scope Out: 9:49:03 AM Scope Withdrawal Time: 0 hours 9 minutes 25 seconds  Total Procedure Duration: 0 hours 14 minutes 18 seconds  Findings:      The perianal and digital rectal examinations were normal.      A 4 mm polyp was found in the rectum. The polyp was sessile. The polyp       was removed with a cold biopsy forceps. Resection and retrieval were       complete. Estimated blood loss was minimal.  The exam was otherwise without abnormality on direct and retroflexion       views. Impression:               - One 4 mm polyp in the rectum, removed with a cold                            biopsy forceps. Resected and retrieved.                           - The examination was otherwise normal on direct                            and retroflexion views. Moderate Sedation:      Moderate (conscious) sedation was administered by the endoscopy nurse       and supervised by the endoscopist. The following parameters were       monitored: oxygen saturation, heart rate, blood pressure, respiratory       rate, EKG, adequacy of pulmonary ventilation, and response to care.       Total physician intraservice time was 21  minutes. Recommendation:           - Patient has a contact number available for                            emergencies. The signs and symptoms of potential                            delayed complications were discussed with the                            patient. Return to normal activities tomorrow.                            Written discharge instructions were provided to the                            patient.                           - Resume previous diet.                           - Continue present medications.                           - Repeat colonoscopy date to be determined after                            pending pathology results are reviewed for                            surveillance based on pathology results.                           - Return to GI office (date not yet determined). Procedure Code(s):        ---  Professional ---                           [id number], Colonoscopy, flexible; with biopsy, single                            or multiple                           G0500, Moderate sedation services provided by the                            same physician or other qualified health care                            professional performing a gastrointestinal                            endoscopic service that sedation supports,                            requiring the presence of an independent trained                            observer to assist in the monitoring of the                            patient's level of consciousness and physiological                            status; initial 15 minutes of intra-service time;                            patient age 60 years or older (additional time may                            be reported with 60454, as appropriate) Diagnosis Code(s):        --- Professional ---                           Z12.11, Encounter for screening for malignant                            neoplasm of colon                           K62.1, Rectal  polyp CPT copyright 2018 [employer]. All rights reserved. The codes documented in this report are preliminary and upon coder review may  be revised to meet current compliance requirements. Christine Friends. Minta Fair, MD Gennette Pac, MD 06/25/2018 9:55:35 AM This report has been signed electronically. Number of Addenda: 0

## 2018-06-25 NOTE — Discharge Instructions (Signed)
°  Colonoscopy Discharge Instructions  Read the instructions outlined below and refer to this sheet in the next few weeks. These discharge instructions provide you with general information on caring for yourself after you leave the hospital. Your doctor may also give you specific instructions. While your treatment has been planned according to the most current medical practices available, unavoidable complications occasionally occur. If you have any problems or questions after discharge, call Dr. Jena Gauss at [phone]. ACTIVITY  You may resume your regular activity, but move at a slower pace for the next 24 hours.   Take frequent rest periods for the next 24 hours.   Walking will help get rid of the air and reduce the bloated feeling in your belly (abdomen).   No driving for 24 hours (because of the medicine (anesthesia) used during the test).    Do not sign any important legal documents or operate any machinery for 24 hours (because of the anesthesia used during the test).  NUTRITION  Drink plenty of fluids.   You may resume your normal diet as instructed by your doctor.   Begin with a light meal and progress to your normal diet. Heavy or fried foods are harder to digest and may make you feel sick to your stomach (nauseated).   Avoid alcoholic beverages for 24 hours or as instructed.  MEDICATIONS  You may resume your normal medications unless your doctor tells you otherwise.  WHAT YOU CAN EXPECT TODAY  Some feelings of bloating in the abdomen.   Passage of more gas than usual.   Spotting of blood in your stool or on the toilet paper.  IF YOU HAD POLYPS REMOVED DURING THE COLONOSCOPY:  No aspirin products for 7 days or as instructed.   No alcohol for 7 days or as instructed.   Eat a soft diet for the next 24 hours.  FINDING OUT THE RESULTS OF YOUR TEST Not all test results are available during your visit. If your test results are not back during the visit, make an appointment  with your caregiver to find out the results. Do not assume everything is normal if you have not heard from your caregiver or the medical facility. It is important for you to follow up on all of your test results.  SEEK IMMEDIATE MEDICAL ATTENTION IF:  You have more than a spotting of blood in your stool.   Your belly is swollen (abdominal distention).   You are nauseated or vomiting.   You have a temperature over 101.   You have abdominal pain or discomfort that is severe or gets worse throughout the day.    1 small polyp removed from your colon  You will receive a letter about the polyp report in about 10 days

## 2018-06-28 ENCOUNTER — Encounter: Payer: Self-pay | Admitting: Internal Medicine

## 2018-07-01 ENCOUNTER — Encounter (HOSPITAL_COMMUNITY): Payer: Self-pay | Admitting: Internal Medicine

## 2018-07-05 ENCOUNTER — Other Ambulatory Visit: Payer: Self-pay

## 2018-07-05 ENCOUNTER — Encounter (HOSPITAL_COMMUNITY): Payer: Self-pay

## 2018-07-05 ENCOUNTER — Emergency Department (HOSPITAL_COMMUNITY)
Admission: EM | Admit: 2018-07-05 | Discharge: 2018-07-05 | Disposition: A | Discharge status: Home / Self Care | Payer: 59 | Attending: Emergency Medicine | Admitting: Emergency Medicine

## 2018-07-05 DIAGNOSIS — Z79899 Other long term (current) drug therapy: Secondary | ICD-10-CM | POA: Diagnosis not present

## 2018-07-05 DIAGNOSIS — Y990 Civilian activity done for income or pay: Secondary | ICD-10-CM | POA: Insufficient documentation

## 2018-07-05 DIAGNOSIS — Y929 Unspecified place or not applicable: Secondary | ICD-10-CM | POA: Insufficient documentation

## 2018-07-05 DIAGNOSIS — Y939 Activity, unspecified: Secondary | ICD-10-CM | POA: Insufficient documentation

## 2018-07-05 DIAGNOSIS — S0003XA Contusion of scalp, initial encounter: Secondary | ICD-10-CM | POA: Insufficient documentation

## 2018-07-05 DIAGNOSIS — F17210 Nicotine dependence, cigarettes, uncomplicated: Secondary | ICD-10-CM | POA: Diagnosis not present

## 2018-07-05 DIAGNOSIS — W19XXXA Unspecified fall, initial encounter: Secondary | ICD-10-CM

## 2018-07-05 DIAGNOSIS — W0110XA Fall on same level from slipping, tripping and stumbling with subsequent striking against unspecified object, initial encounter: Secondary | ICD-10-CM | POA: Diagnosis not present

## 2018-07-05 DIAGNOSIS — M25552 Pain in left hip: Secondary | ICD-10-CM | POA: Insufficient documentation

## 2018-07-05 DIAGNOSIS — R51 Headache: Secondary | ICD-10-CM | POA: Diagnosis present

## 2018-07-05 NOTE — Discharge Instructions (Addendum)
Recheck with your nurse on staff next week.  Return to ER for worsening or concerning symptoms. Take Motrin or Tylenol as needed as directed for pain.

## 2018-07-05 NOTE — ED Triage Notes (Signed)
Pt was at work today, while walking slipped on a dust pan with feet coming out from under her. Pt fell back hitting her head and landing on lower left back/hip. Pt. Ambulated for EMS, denies LOC, denies blood thinners.

## 2018-07-05 NOTE — ED Provider Notes (Signed)
[facility] EMERGENCY DEPARTMENT Provider Note   CSN: [id number] Arrival date & time: 07/05/18  1525     History   Chief Complaint Chief Complaint  Patient presents with  . Fall    HPI Christine Tate is a 52 y.o. female.  52 year old female presents with injuries from a fall.  Patient states that she was at work today when she slipped and fell landing on her left lower back and hitting the back of her head.  Patient denies loss of consciousness, is not on blood thinners, was able to stand on her own after the fall.  She denies changes in vision, nausea or vomiting, severe headache, confusion.  Patient states that she feels like she has had a fall otherwise feels well and would like to be cleared medically.  No other complaints or concerns.     Past Medical History:  Diagnosis Date  . History of kidney stones     There are no active problems to display for this patient.   Past Surgical History:  Procedure Laterality Date  . ABDOMINAL HYSTERECTOMY    . ANTERIOR CRUCIATE LIGAMENT REPAIR    . COLONOSCOPY N/A 06/25/2018   Procedure: COLONOSCOPY;  Surgeon: Daneil Dolin, MD;  Location: [facility] ENDO SUITE;  Service: Endoscopy;  Laterality: N/A;  9:45  . CYSTECTOMY    . POLYPECTOMY  06/25/2018   Procedure: POLYPECTOMY;  Surgeon: Daneil Dolin, MD;  Location: [facility] ENDO SUITE;  Service: Endoscopy;;  . TUBAL LIGATION       OB History   None      Home Medications    Prior to Admission medications   Medication Sig Start Date End Date Taking? Authorizing Provider  modafinil (PROVIGIL) 200 MG tablet Take 200 mg by mouth 2 (two) times daily.     [provider]  Na Sulfate-K Sulfate-Mg Sulf (SUPREP BOWEL PREP KIT) 17.5-3.13-1.6 GM/177ML SOLN Take 1 kit by mouth as directed. 05/13/18   Carlis Stable, NP    Family History Family History  Problem Relation Age of Onset  . Heart Problems Mother   . Breast cancer Sister     Social History Social  History   Tobacco Use  . Smoking status: Current Every Day Smoker    Packs/day: 1.00  . Smokeless tobacco: Never Used  Substance Use Topics  . Alcohol use: Yes    Comment: occationally  . Drug use: No     Allergies   Bee venom; Hydrocodone; and Poison sumac extract   Review of Systems Review of Systems  Constitutional: Negative for fever.  Eyes: Negative for visual disturbance.  Gastrointestinal: Negative for nausea and vomiting.  Musculoskeletal: Positive for myalgias. Negative for arthralgias, back pain and joint swelling.  Skin: Negative for rash and wound.  Allergic/Immunologic: Negative for immunocompromised state.  Neurological: Negative for dizziness, weakness and headaches.  Hematological: Does not bruise/bleed easily.  Psychiatric/Behavioral: Negative for confusion.  All other systems reviewed and are negative.    Physical Exam Updated Vital Signs BP (!) 141/87 (BP Location: Right Arm)   Pulse (!) 56   Temp 98.4 F (36.9 C) (Oral)   Resp 14   Ht _0  (1.626 m)   Wt 56.7 kg   SpO2 100%   BMI 21.46 kg/m   Physical Exam  Constitutional: She is oriented to person, place, and time. She appears well-developed and well-nourished. No distress.  HENT:  Head:    Eyes: Pupils are equal, round, and reactive to  light. Conjunctivae and EOM are normal.  Neck: Neck supple.  Cardiovascular: Intact distal pulses.  Musculoskeletal: Normal range of motion. She exhibits tenderness. She exhibits no deformity.       Left hip: Normal.       Back:  Neurological: She is alert and oriented to person, place, and time. She has normal strength. No cranial nerve deficit or sensory deficit. Coordination and gait normal. GCS eye subscore is 4. GCS verbal subscore is 5. GCS motor subscore is 6.  Skin: Skin is warm and dry. No rash noted. She is not diaphoretic.  Psychiatric: She has a normal mood and affect. Her behavior is normal.  Nursing note and vitals reviewed.    ED  Treatments / Results  Labs (all labs ordered are listed, but only abnormal results are displayed) Labs Reviewed - No data to display  EKG None  Radiology No results found.  Procedures Procedures (including critical care time)  Medications Ordered in ED Medications - No data to display   Initial Impression / Assessment and Plan / ED Course  I have reviewed the triage vital signs and the nursing notes.  Pertinent labs & imaging results that were available during my care of the patient were reviewed by me and considered in my medical decision making (see chart for details).  Clinical Course as of Jul 05 1654  Sat Nov 02, 263  7649 51 year old female brought in by employer from work for a fall which occurred at work.  Patient reports pain in her left posterior hip as well as a bump on her head.  Nuys any concerning signs or symptoms, is able to bear weight without assistance.  Patient would like to be cleared to return to work, claims any pain medication at this time.  Discussed with patient and her employer, patient may return to work however if she has any ongoing pain she should be reevaluated.  Advised patient to take Motrin Tylenol as needed as directed.  Patient is agreeable with discharge plan.   [LM]    Clinical Course User Index [LM] Tymesha, [place], PA-C   Final Clinical Impressions(s) / ED Diagnoses   Final diagnoses:  Fall, initial encounter  Contusion of scalp, initial encounter  Posterior pain of left hip    ED Discharge Orders    None       Karolyna, Bianchini 07/05/18 1655    Virgel Manifold, MD 07/05/18 2138

## 2018-10-17 DIAGNOSIS — G4726 Circadian rhythm sleep disorder, shift work type: Secondary | ICD-10-CM | POA: Diagnosis not present

## 2018-10-17 DIAGNOSIS — Z6820 Body mass index (BMI) 20.0-20.9, adult: Secondary | ICD-10-CM | POA: Diagnosis not present

## 2019-11-04 ENCOUNTER — Other Ambulatory Visit (HOSPITAL_COMMUNITY): Payer: Self-pay | Admitting: Family Medicine

## 2019-11-04 DIAGNOSIS — Z1231 Encounter for screening mammogram for malignant neoplasm of breast: Secondary | ICD-10-CM

## 2019-11-23 ENCOUNTER — Other Ambulatory Visit: Payer: Self-pay

## 2019-11-23 ENCOUNTER — Ambulatory Visit (HOSPITAL_COMMUNITY)
Admission: RE | Admit: 2019-11-23 | Discharge: 2019-11-23 | Disposition: A | Discharge status: Home / Self Care | Payer: 59 | Source: Ambulatory Visit | Attending: Family Medicine | Admitting: Family Medicine

## 2019-11-23 DIAGNOSIS — Z1231 Encounter for screening mammogram for malignant neoplasm of breast: Secondary | ICD-10-CM | POA: Insufficient documentation

## 2020-05-23 ENCOUNTER — Other Ambulatory Visit (HOSPITAL_COMMUNITY): Payer: Self-pay | Admitting: Physician Assistant

## 2020-05-23 ENCOUNTER — Other Ambulatory Visit: Payer: Self-pay

## 2020-05-23 ENCOUNTER — Ambulatory Visit (HOSPITAL_COMMUNITY)
Admission: RE | Admit: 2020-05-23 | Discharge: 2020-05-23 | Disposition: A | Discharge status: Home / Self Care | Payer: 59 | Source: Ambulatory Visit | Attending: Physician Assistant | Admitting: Physician Assistant

## 2020-05-23 DIAGNOSIS — M25511 Pain in right shoulder: Secondary | ICD-10-CM | POA: Diagnosis present

## 2022-02-03 ENCOUNTER — Emergency Department (HOSPITAL_COMMUNITY)
Admission: EM | Admit: 2022-02-03 | Discharge: 2022-02-03 | Disposition: A | Discharge status: Home / Self Care | Payer: 59 | Attending: Emergency Medicine | Admitting: Emergency Medicine

## 2022-02-03 ENCOUNTER — Emergency Department (HOSPITAL_COMMUNITY): Payer: 59

## 2022-02-03 ENCOUNTER — Other Ambulatory Visit: Payer: Self-pay

## 2022-02-03 ENCOUNTER — Encounter (HOSPITAL_COMMUNITY): Payer: Self-pay

## 2022-02-03 DIAGNOSIS — R059 Cough, unspecified: Secondary | ICD-10-CM | POA: Diagnosis not present

## 2022-02-03 DIAGNOSIS — R5383 Other fatigue: Secondary | ICD-10-CM

## 2022-02-03 DIAGNOSIS — R630 Anorexia: Secondary | ICD-10-CM | POA: Diagnosis not present

## 2022-02-03 DIAGNOSIS — R0981 Nasal congestion: Secondary | ICD-10-CM | POA: Insufficient documentation

## 2022-02-03 DIAGNOSIS — R42 Dizziness and giddiness: Secondary | ICD-10-CM | POA: Diagnosis present

## 2022-02-03 DIAGNOSIS — R110 Nausea: Secondary | ICD-10-CM | POA: Diagnosis not present

## 2022-02-03 DIAGNOSIS — R531 Weakness: Secondary | ICD-10-CM | POA: Diagnosis not present

## 2022-02-03 DIAGNOSIS — Z20822 Contact with and (suspected) exposure to covid-19: Secondary | ICD-10-CM | POA: Diagnosis not present

## 2022-02-03 LAB — CBC WITH DIFFERENTIAL/PLATELET
Abs Immature Granulocytes: 0.03 10*3/uL (ref 0.00–0.07)
Basophils Absolute: 0.1 10*3/uL (ref 0.0–0.1)
Basophils Relative: 1 %
Eosinophils Absolute: 0.1 10*3/uL (ref 0.0–0.5)
Eosinophils Relative: 1 %
HCT: 47.1 % — ABNORMAL HIGH (ref 36.0–46.0)
Hemoglobin: 15.8 g/dL — ABNORMAL HIGH (ref 12.0–15.0)
Immature Granulocytes: 0 %
Lymphocytes Relative: 18 %
Lymphs Abs: 2.1 10*3/uL (ref 0.7–4.0)
MCH: 31 pg (ref 26.0–34.0)
MCHC: 33.5 g/dL (ref 30.0–36.0)
MCV: 92.5 fL (ref 80.0–100.0)
Monocytes Absolute: 0.6 10*3/uL (ref 0.1–1.0)
Monocytes Relative: 5 %
Neutro Abs: 8.6 10*3/uL — ABNORMAL HIGH (ref 1.7–7.7)
Neutrophils Relative %: 75 %
Platelets: 321 10*3/uL (ref 150–400)
RBC: 5.09 MIL/uL (ref 3.87–5.11)
RDW: 14.4 % (ref 11.5–15.5)
WBC: 11.5 10*3/uL — ABNORMAL HIGH (ref 4.0–10.5)
nRBC: 0 % (ref 0.0–0.2)

## 2022-02-03 LAB — COMPREHENSIVE METABOLIC PANEL
ALT: 85 U/L — ABNORMAL HIGH (ref 0–44)
AST: 39 U/L (ref 15–41)
Albumin: 4.1 g/dL (ref 3.5–5.0)
Alkaline Phosphatase: 123 U/L (ref 38–126)
Anion gap: 6 (ref 5–15)
BUN: 24 mg/dL — ABNORMAL HIGH (ref 6–20)
CO2: 25 mmol/L (ref 22–32)
Calcium: 9.6 mg/dL (ref 8.9–10.3)
Chloride: 107 mmol/L (ref 98–111)
Creatinine, Ser: 0.58 mg/dL (ref 0.44–1.00)
GFR, Estimated: >60 mL/min (ref >60)
Glucose, Bld: 105 mg/dL — ABNORMAL HIGH (ref 70–99)
Potassium: 3.9 mmol/L (ref 3.5–5.1)
Sodium: 138 mmol/L (ref 135–145)
Total Bilirubin: 0.4 mg/dL (ref 0.3–1.2)
Total Protein: 7.1 g/dL (ref 6.5–8.1)

## 2022-02-03 LAB — SARS CORONAVIRUS 2 BY RT PCR: SARS Coronavirus 2 by RT PCR: NEGATIVE

## 2022-02-03 MED ORDER — PREDNISONE 20 MG PO TABS: 40.0000 mg | ORAL_TABLET | Freq: Every day | ORAL | 0 refills | Status: DC

## 2022-02-03 MED ORDER — SODIUM CHLORIDE 0.9 % IV BOLUS
1000.0000 mL | Freq: Once | INTRAVENOUS | Status: AC
  Administered 2022-02-03: 1000 mL via INTRAVENOUS

## 2022-02-03 MED ORDER — AZITHROMYCIN 250 MG PO TABS: 250.0000 mg | ORAL_TABLET | Freq: Every day | ORAL | 0 refills | Status: AC

## 2022-02-03 MED ORDER — METHYLPREDNISOLONE SODIUM SUCC 125 MG IJ SOLR
125.0000 mg | INTRAMUSCULAR | Status: AC
  Administered 2022-02-03: 125 mg via INTRAVENOUS
  Filled 2022-02-03: qty 2

## 2022-02-03 MED ORDER — AZITHROMYCIN 250 MG PO TABS
500.0000 mg | ORAL_TABLET | Freq: Once | ORAL | Status: AC
  Administered 2022-02-03: 500 mg via ORAL
  Filled 2022-02-03: qty 2

## 2022-02-03 NOTE — ED Notes (Signed)
Patient transported to CT 

## 2022-02-03 NOTE — ED Provider Notes (Signed)
[facility] EMERGENCY DEPARTMENT Provider Note   CSN: [id number] Arrival date & time: 02/03/22  1059     History  Chief Complaint  Patient presents with   Dizziness    Christine Tate is a 56 y.o. female.  HPI Patient presents with cough, congestion, weakness, nausea, anorexia.  No focal pain.  Onset was about 1 week ago, since that time symptoms have been persistent.  She waited until today due to need to arrange transportation. She is a smoker.  She states that she is otherwise generally well, however.    Home Medications Prior to Admission medications   Medication Sig Start Date End Date Taking? Authorizing Provider  azithromycin (ZITHROMAX) 250 MG tablet Take 1 tablet (250 mg total) by mouth daily for 4 days. Take 1 every day until finished. 02/03/22 02/07/22 Yes Carmin Muskrat, MD  predniSONE (DELTASONE) 20 MG tablet Take 2 tablets (40 mg total) by mouth daily with breakfast. For the next four days 02/03/22  Yes Carmin Muskrat, MD  modafinil (PROVIGIL) 200 MG tablet Take 200 mg by mouth 2 (two) times daily.     [provider]  Na Sulfate-K Sulfate-Mg Sulf (SUPREP BOWEL PREP KIT) 17.5-3.13-1.6 GM/177ML SOLN Take 1 kit by mouth as directed. 05/13/18   Carlis Stable, NP      Allergies    Bee venom, Hydrocodone, and Poison sumac extract    Review of Systems   Review of Systems  All other systems reviewed and are negative.  Physical Exam Updated Vital Signs BP 132/82   Pulse 65   Temp 98 F (36.7 C)   Resp 11   Ht _0  (1.626 m)   Wt 61.2 kg   SpO2 100%   BMI 23.17 kg/m  Physical Exam Vitals and nursing note reviewed.  Constitutional:      General: She is not in acute distress.    Appearance: She is well-developed.  HENT:     Head: Normocephalic and atraumatic.  Eyes:     Conjunctiva/sclera: Conjunctivae normal.  Cardiovascular:     Rate and Rhythm: Normal rate and regular rhythm.  Pulmonary:     Effort: Pulmonary effort is normal. No respiratory  distress.     Breath sounds: Normal breath sounds. No stridor.  Abdominal:     General: There is no distension.  Skin:    General: Skin is warm and dry.  Neurological:     Mental Status: She is alert and oriented to person, place, and time.     Cranial Nerves: No cranial nerve deficit, dysarthria or facial asymmetry.     Motor: Atrophy present. No tremor or abnormal muscle tone.     Coordination: Coordination normal.  Psychiatric:        Mood and Affect: Mood normal.    ED Results / Procedures / Treatments   Labs (all labs ordered are listed, but only abnormal results are displayed) Labs Reviewed  COMPREHENSIVE METABOLIC PANEL - Abnormal; Notable for the following components:      Result Value   Glucose, Bld 105 (*)    BUN 24 (*)    ALT 85 (*)    All other components within normal limits  CBC WITH DIFFERENTIAL/PLATELET - Abnormal; Notable for the following components:   WBC 11.5 (*)    Hemoglobin 15.8 (*)    HCT 47.1 (*)    Neutro Abs 8.6 (*)    All other components within normal limits  SARS CORONAVIRUS 2 BY RT PCR  EKG EKG Interpretation  Date/Time:  Saturday February 03 2022 11:16:33 EDT Ventricular Rate:  79 PR Interval:  154 QRS Duration: 76 QT Interval:  383 QTC Calculation: 439 R Axis:   47 Text Interpretation: Sinus rhythm Probable left atrial enlargement Anteroseptal infarct, age indeterminate Baseline wander in lead(s) III aVL aVF Abnormal ECG Confirmed by Carmin Muskrat [phone]) on 02/03/2022 11:43:30 AM  Radiology DG Chest 2 View  Result Date: 02/03/2022 CLINICAL DATA:  56 year old smoker and cough. Generalized weakness and dizziness. EXAM: CHEST - 2 VIEW COMPARISON:  None Available. FINDINGS: The heart size and mediastinal contours are within normal limits. Both lungs are clear. The visualized skeletal structures are unremarkable. IMPRESSION: No active cardiopulmonary disease. Electronically Signed   By: Markus Daft M.D.   On: 02/03/2022 11:47   CT Head Wo  Contrast  Result Date: 02/03/2022 CLINICAL DATA:  Syncopal episode. Altered mental status. Weakness and dizziness for 1 week. EXAM: CT HEAD WITHOUT CONTRAST TECHNIQUE: Contiguous axial images were obtained from the base of the skull through the vertex without intravenous contrast. RADIATION DOSE REDUCTION: This exam was performed according to the departmental dose-optimization program which includes automated exposure control, adjustment of the mA and/or kV according to patient size and/or use of iterative reconstruction technique. COMPARISON:  None Available. FINDINGS: Brain: No evidence of intracranial hemorrhage, acute infarction, hydrocephalus, extra-axial collection, or mass lesion/mass effect. Vascular:  No hyperdense vessel or other acute findings. Skull: No evidence of fracture or other significant bone abnormality. Sinuses/Orbits:  No acute findings. Other: None. IMPRESSION: Negative noncontrast head CT. Electronically Signed   By: Marlaine Hind M.D.   On: 02/03/2022 13:21    Procedures Procedures    Medications Ordered in ED Medications  azithromycin (ZITHROMAX) tablet 500 mg (has no administration in time range)  sodium chloride 0.9 % bolus 1,000 mL (0 mLs Intravenous Stopped 02/03/22 1257)  methylPREDNISolone sodium succinate (SOLU-MEDROL) 125 mg/2 mL injection 125 mg (125 mg Intravenous Given 02/03/22 1332)    ED Course/ Medical Decision Making/ A&P This patient with a Hx of cigarette addiction presents to the ED for concern of off, congestion, fatigue, this involves an extensive number of treatment options, and is a complaint that carries with it a high risk of complications and morbidity.    The differential diagnosis includes pneumonia, bronchitis, bacteremia, sepsis, COVID, less likely ACS given the absence of pain   Social Determinants of Health:  Cigarette addiction  Additional history obtained:  Additional history and/or information obtained from chart review, notable for  gastroenterology note from few years ago, with noted history of kidney stones at that point, GI follow-up   After the initial evaluation, orders, including: Labs x-ray COVID were initiated.   Patient placed on Cardiac and Pulse-Oximetry Monitors. The patient was maintained on a cardiac monitor.  The cardiac monitored showed an rhythm of 90 sinus normal The patient was also maintained on pulse oximetry. The readings were typically 100% room air normal   On repeat evaluation of the patient stayed the same  Lab Tests:  I personally interpreted labs.  The pertinent results include: Trivial hyperglycemia and mild leukocytosis  Imaging Studies ordered:  I independently visualized and interpreted imaging which showed no intracranial abnormality, no pneumonia on x-ray I agree with the radiologist interpretation  1:48 PM Patient discussed all findings, including reassuring head CT, chest x-ray.  No evidence for acute new phenomenon including pneumonia bacteremia sepsis stroke given reassuring physical exam, imaging, labs.  Patient is moving her head freely,  low evidence for BPV.  With her smoking history, use of Provigil, some suspicion for acute on chronic changes.  She and I discussed other possible causes, and initiation of meds for possible COPD exacerbation given her leukocytosis, smoking history, fatigue and close outpatient follow-up, which she will facilitate calling in 2 days.      Final Clinical Impression(s) / ED Diagnoses Final diagnoses:  Dizziness  Other fatigue    Rx / DC Orders ED Discharge Orders          Ordered    predniSONE (DELTASONE) 20 MG tablet  Daily with breakfast        02/03/22 1348    azithromycin (ZITHROMAX) 250 MG tablet  Daily        02/03/22 1348              Carmin Muskrat, MD 02/03/22 1348

## 2022-02-03 NOTE — ED Notes (Signed)
Patient transported to X-ray 

## 2022-02-03 NOTE — ED Triage Notes (Signed)
Reports generalized weakness and dizziness x 1 week with cough and congestion.  Pt is alert and ambulatory to room.  Resp even and unlabored.  Skin warm and dry. nad

## 2022-02-03 NOTE — Discharge Instructions (Signed)
As discussed, your evaluation today has been largely reassuring.  But, it is important that you monitor your condition carefully, and do not hesitate to return to the ED if you develop new, or concerning changes in your condition.  Your symptoms may be due to your history of smoking, and subsequent changes in your system, including emphysema.  You are trying a new medication, this should improve your condition, please discuss this when you see your physician in 2 days.

## 2022-03-08 ENCOUNTER — Other Ambulatory Visit (HOSPITAL_COMMUNITY): Payer: Self-pay | Admitting: Family Medicine

## 2022-03-08 DIAGNOSIS — Z1231 Encounter for screening mammogram for malignant neoplasm of breast: Secondary | ICD-10-CM

## 2023-02-11 ENCOUNTER — Other Ambulatory Visit: Payer: Self-pay

## 2023-02-11 ENCOUNTER — Emergency Department (HOSPITAL_COMMUNITY)
Admission: EM | Admit: 2023-02-11 | Discharge: 2023-02-11 | Disposition: A | Discharge status: Home / Self Care | Payer: 59 | Attending: Emergency Medicine | Admitting: Emergency Medicine

## 2023-02-11 DIAGNOSIS — W5501XA Bitten by cat, initial encounter: Secondary | ICD-10-CM | POA: Diagnosis not present

## 2023-02-11 DIAGNOSIS — S51812A Laceration without foreign body of left forearm, initial encounter: Secondary | ICD-10-CM | POA: Insufficient documentation

## 2023-02-11 DIAGNOSIS — Z23 Encounter for immunization: Secondary | ICD-10-CM | POA: Insufficient documentation

## 2023-02-11 DIAGNOSIS — S61230A Puncture wound without foreign body of right index finger without damage to nail, initial encounter: Secondary | ICD-10-CM | POA: Insufficient documentation

## 2023-02-11 DIAGNOSIS — T148XXA Other injury of unspecified body region, initial encounter: Secondary | ICD-10-CM

## 2023-02-11 DIAGNOSIS — W540XXA Bitten by dog, initial encounter: Secondary | ICD-10-CM | POA: Diagnosis not present

## 2023-02-11 MED ORDER — AMOXICILLIN-POT CLAVULANATE 875-125 MG PO TABS
1.0000 | ORAL_TABLET | Freq: Once | ORAL | Status: AC
  Administered 2023-02-11: 1 via ORAL
  Filled 2023-02-11: qty 1

## 2023-02-11 MED ORDER — TETANUS-DIPHTH-ACELL PERTUSSIS 5-2.5-18.5 LF-MCG/0.5 IM SUSY
0.5000 mL | PREFILLED_SYRINGE | Freq: Once | INTRAMUSCULAR | Status: AC
Start: 2023-02-11 — End: 2023-02-11
  Administered 2023-02-11: 0.5 mL via INTRAMUSCULAR
  Filled 2023-02-11: qty 0.5

## 2023-02-11 MED ORDER — AMOXICILLIN-POT CLAVULANATE 875-125 MG PO TABS: 1.0000 | ORAL_TABLET | Freq: Two times a day (BID) | ORAL | 0 refills | Status: DC

## 2023-02-11 MED ORDER — OXYCODONE-ACETAMINOPHEN 5-325 MG PO TABS: 1.0000 | ORAL_TABLET | Freq: Four times a day (QID) | ORAL | 0 refills | Status: DC | PRN

## 2023-02-11 NOTE — ED Triage Notes (Signed)
Pt was protecting a kitten from her dog and dog bit her on left forearm.  Kitten bit her on right hand.  Dog is up to date on rabies vaccinations. Armandina Stammer is only 72 weeks old and has had no vaccines.

## 2023-02-11 NOTE — ED Notes (Signed)
Pt with small lac noted to L forearm. Wound irrigated with normal saline. Clean gauze placed over wound. No further bleeding, NAD at this time.  Pt states she has no concerns for rabies with either animal, believes tetanus is out of date.

## 2023-02-11 NOTE — ED Provider Notes (Signed)
[place] [facility]GENC[facility] DEPARTMENT AT [facility] Provider Note   CSN: [id number] Arrival date & time: 02/11/23  1712     History  Chief Complaint  Patient presents with   Animal Bite    Christine Tate is a 57 y.o. female.   Animal Bite Associated symptoms: no fever        Christine Tate is a 57 y.o. female who presents to the Emergency Department complaining of dog bite to her left forearm and pinpoint puncture wounds to her right index finger from a kitten bite.  States she was trying to protect a very young kitten from her dog.  Dog bit her on the left forearm.  She cleaned the wounds with tap water.  Bleeding controlled.  She complains of soreness to R arm with movement of her hand.  She has a numb tingling sensation to the palmar aspect of her proximal thumb.  Denies any numbness tingling or pain of her wrist elbow or other fingers.  States her dog is up-to-date on vaccinations.  Patient's last tetanus is unknown    Home Medications Prior to Admission medications   Medication Sig Start Date End Date Taking? Authorizing Provider  modafinil (PROVIGIL) 200 MG tablet Take 200 mg by mouth 2 (two) times daily.     [provider]  Na Sulfate-K Sulfate-Mg Sulf (SUPREP BOWEL PREP KIT) 17.5-3.13-1.6 GM/177ML SOLN Take 1 kit by mouth as directed. 05/13/18   Anice Paganini, NP  predniSONE (DELTASONE) 20 MG tablet Take 2 tablets (40 mg total) by mouth daily with breakfast. For the next four days 02/03/22   Gerhard Munch, MD      Allergies    Bee venom, Hydrocodone, and Poison sumac extract    Review of Systems   Review of Systems  Constitutional:  Negative for chills and fever.  Musculoskeletal:  Negative for arthralgias.  Skin:  Positive for wound.       Dog bite left forearm, cat bite right index finger  Neurological:  Negative for dizziness, weakness and headaches.    Physical Exam Updated Vital Signs BP (!) 176/107 (BP Location: Right Arm)   Pulse 79    Temp 98 F (36.7 C) (Oral)   Resp 20   SpO2 97%  Physical Exam Vitals and nursing note reviewed.  Constitutional:      General: She is not in acute distress.    Appearance: Normal appearance. She is not ill-appearing or toxic-appearing.  HENT:     Head: Atraumatic.  Cardiovascular:     Rate and Rhythm: Normal rate and regular rhythm.     Pulses: Normal pulses.  Pulmonary:     Effort: Pulmonary effort is normal.  Musculoskeletal:        General: Tenderness and signs of injury present. No swelling.     Comments: Several small superficial appearing puncture wounds along the proximal to mid right index finger.  No bleeding. 1 two centimeter laceration and 2 one cm lacerations to the dorsal aspect of the mid left forearm.  No bleeding.   Pt has full ROM of all fingers of both hands.    Skin:    General: Skin is warm.     Capillary Refill: Capillary refill takes less than 2 seconds.     Findings: No erythema.  Neurological:     General: No focal deficit present.     Mental Status: She is alert.     Sensory: No sensory deficit.  Motor: No weakness.     ED Results / Procedures / Treatments   Labs (all labs ordered are listed, but only abnormal results are displayed) Labs Reviewed - No data to display  EKG None  Radiology No results found.  Procedures Procedures    Medications Ordered in ED Medications  Tdap (BOOSTRIX) injection 0.5 mL (has no administration in time range)  amoxicillin-clavulanate (AUGMENTIN) 875-125 MG per tablet 1 tablet (has no administration in time range)    ED Course/ Medical Decision Making/ A&P                             Medical Decision Making Patient here with superficial appearing puncture wounds to the right index finger and 3 lacerations to mid left forearm.  Bites from a 28-week-old kitten and from her own dog.  Dog's vaccinations are up-to-date.  Wounds cleaned with tap water prior to arrival.  Patient's last Td unknown.   Neurovascularly intact.  Patient has full range of motion of the wrist and all fingers of both hands.  Patient will need Td updated, wound care and will start antibiotics with initial dose given this evening.  Bite wound from her own animal, vaccinations are up-to-date as reported by patient.  Amount and/or Complexity of Data Reviewed Discussion of management or test interpretation with external provider(s): Wound cleaned and bandaged, Td updated here.  Initial dose of Augmentin given.  No indication for initiation of rabies vaccines at this time.  Patient agreeable to close outpatient follow-up with PCP return precautions were also discussed.  Risk Prescription drug management.           Final Clinical Impression(s) / ED Diagnoses Final diagnoses:  Animal bite    Rx / DC Orders ED Discharge Orders          Ordered    amoxicillin-clavulanate (AUGMENTIN) 875-125 MG tablet  Every 12 hours        02/11/23 2228    oxyCODONE-acetaminophen (PERCOCET/ROXICET) 5-325 MG tablet  Every 6 hours PRN        02/11/23 2228              Pauline Aus, PA-C 02/11/23 2303    Bethann Berkshire, MD 02/12/23 1254

## 2023-02-11 NOTE — ED Notes (Signed)
Pt ambulated to the restroom.

## 2023-02-11 NOTE — Discharge Instructions (Signed)
Clean the wounds with antibacterial soap and water.  Keep bandaged.  Take the antibiotic as directed until finished.  Please follow-up with your primary care provider for recheck return to the emergency department for any new or worsening symptoms or signs of infection.

## 2023-02-12 MED FILL — Oxycodone w/ Acetaminophen Tab 5-325 MG: ORAL | Qty: 6 | Status: AC

## 2023-03-14 IMAGING — CT CT HEAD W/O CM
3 of 4 series · 16 of 47 positions shown, 19 images · non-contrast
Comparison: None Available.

CLINICAL DATA: Syncopal episode. Altered mental status. Weakness
and dizziness for 1 week.



[Series 3: head w o · axial · 0.42mm/px · z∈[+28,+163]mm · 10 of 33 slices shown, 13 images]
[im 3/33  brain]
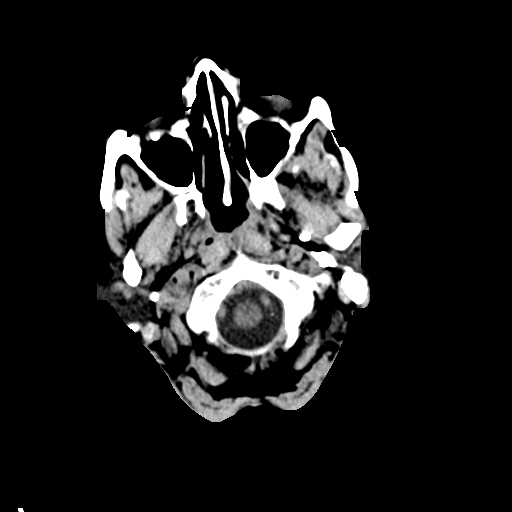
[im 3/33  bone]
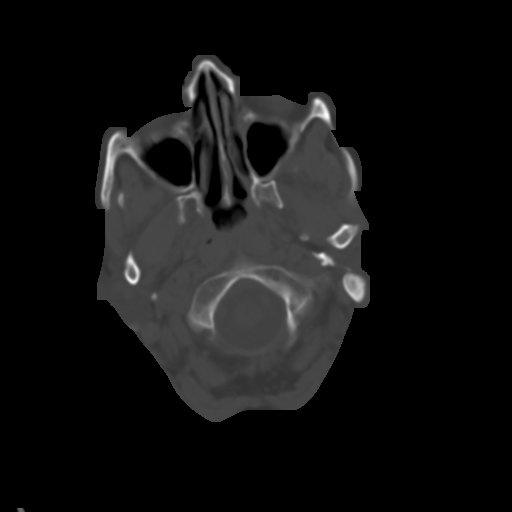
[im 5/33  brain]
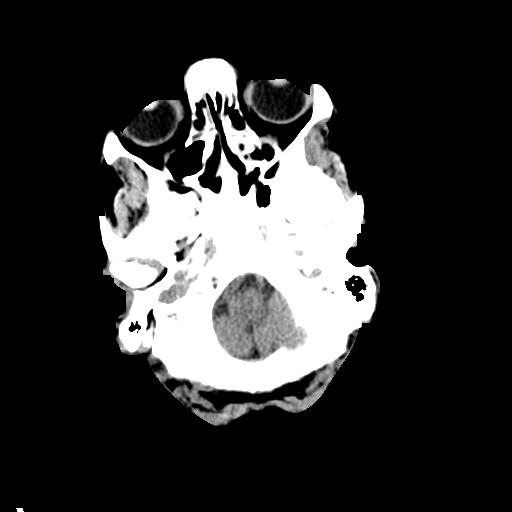
[im 10/33  brain]
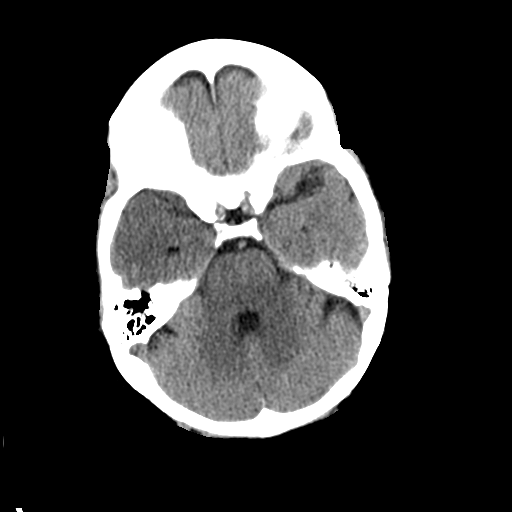
[im 12/33  brain]
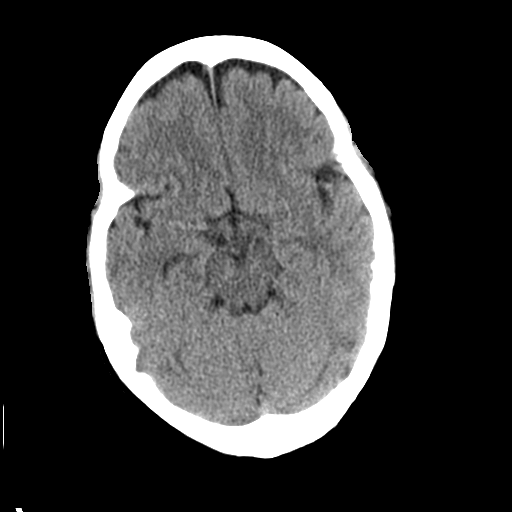
[im 14/33  brain]
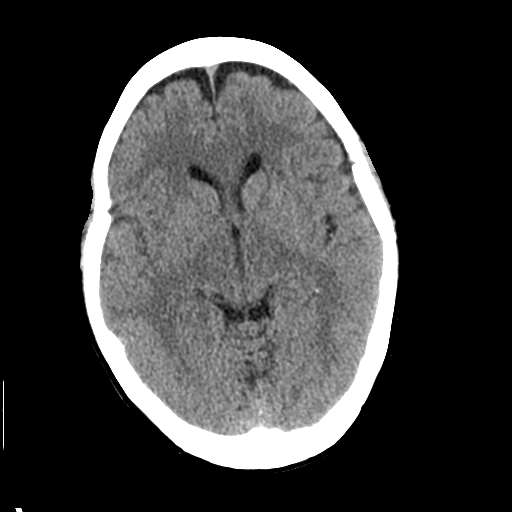
[im 14/33  bone]
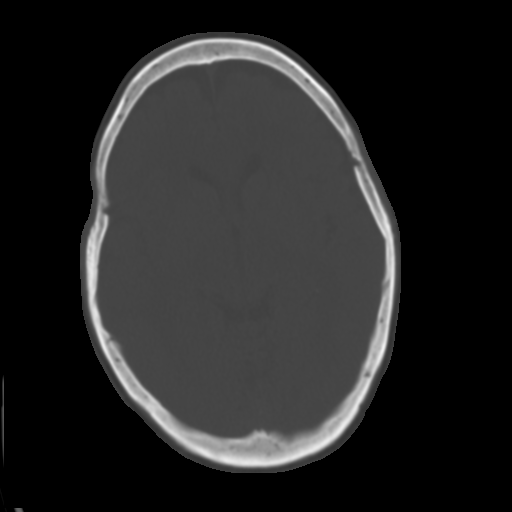
[im 19/33  brain]
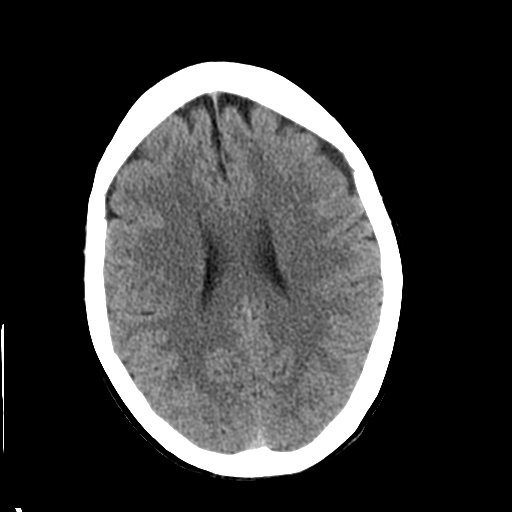
[im 21/33  brain]
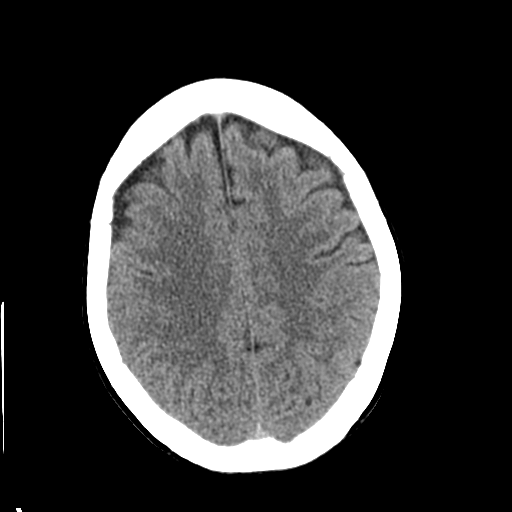
[im 23/33  brain]
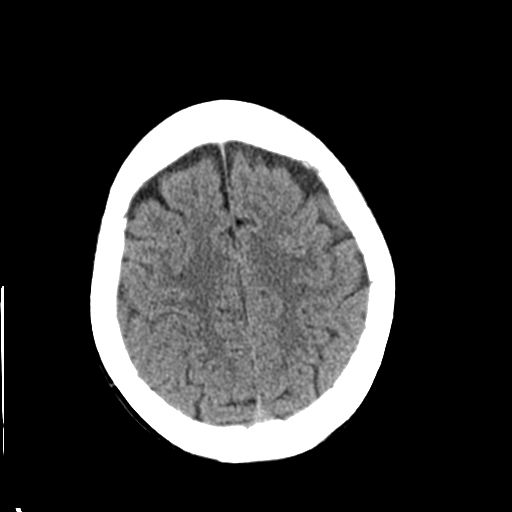
[im 28/33  brain]
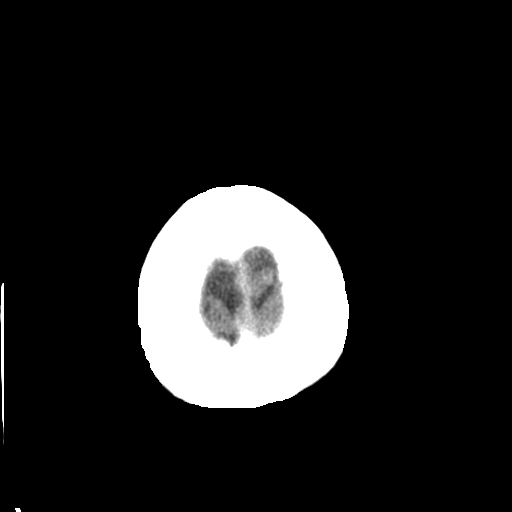
[im 28/33  bone]
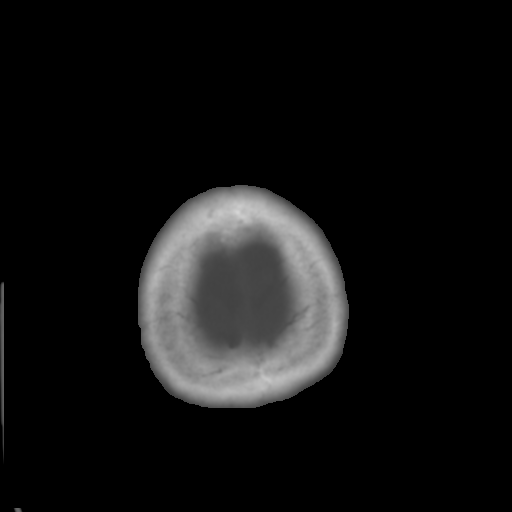
[im 30/33  brain]
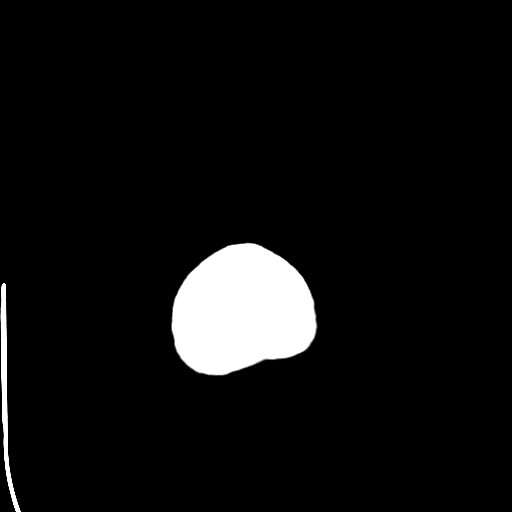

[Series 4: coronal soft · coronal · 0.32mm/px · 3 of 69 slices shown]
[im 23/69  brain]
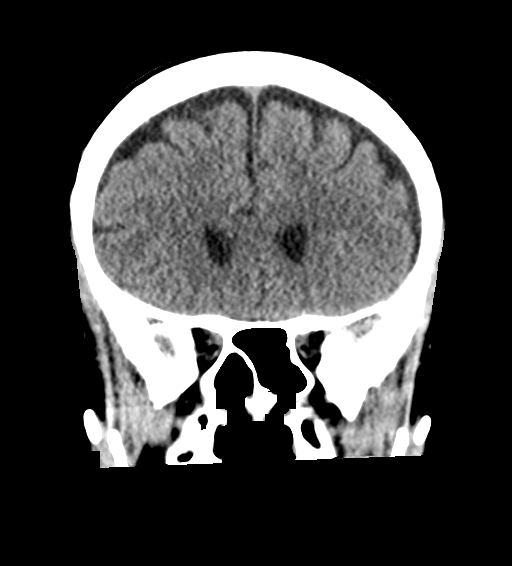
[im 31/69  brain]
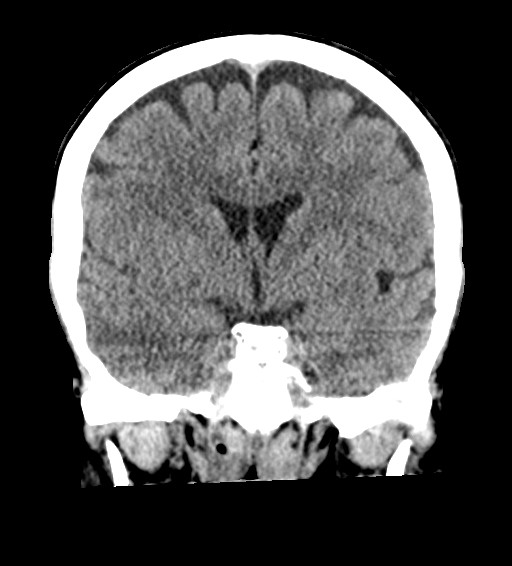
[im 38/69  brain]
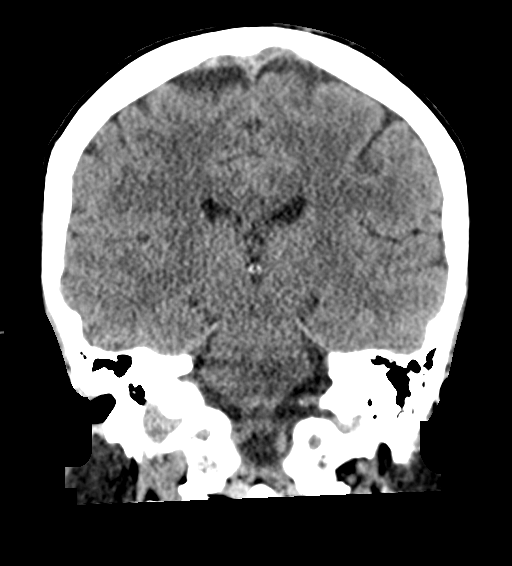

[Series 5: sagittal soft · sagittal · 0.33mm/px · 3 of 54 slices shown]
[im 18/54  brain]
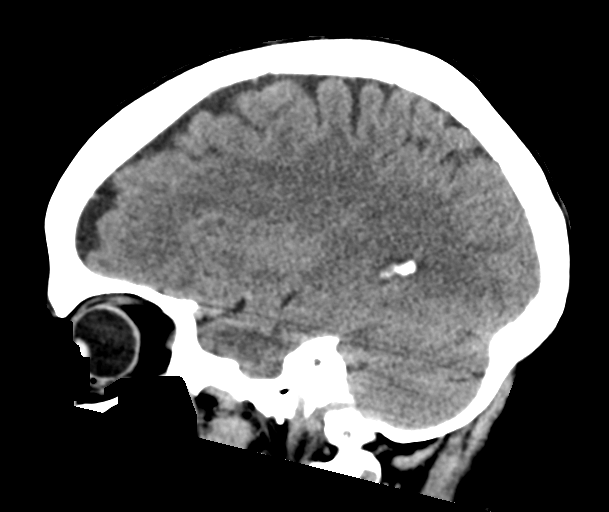
[im 27/54  brain]
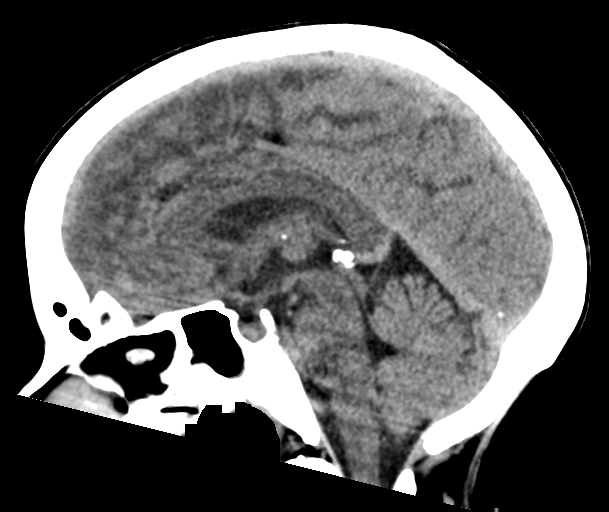
[im 36/54  brain]
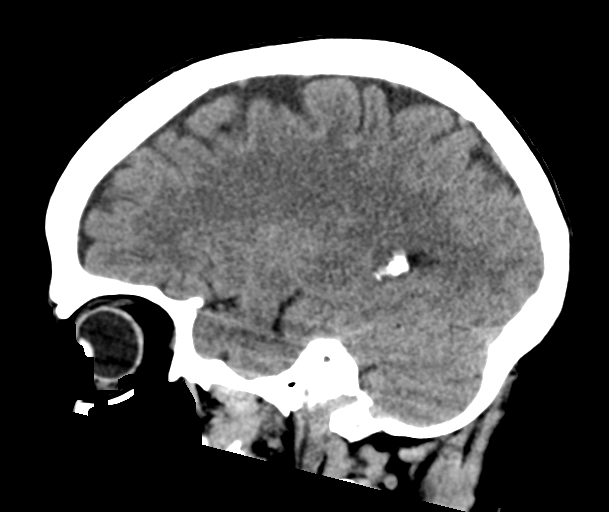

[16 of 47 positions shown; findings below may reference images not displayed]

FINDINGS: Brain: No evidence of intracranial hemorrhage, acute infarction,
hydrocephalus, extra-axial collection, or mass lesion/mass effect.

Vascular:  No hyperdense vessel or other acute findings.

Skull: No evidence of fracture or other significant bone
abnormality.

Sinuses/Orbits:  No acute findings.

Other: None.
IMPRESSION: Negative noncontrast head CT.

## 2023-10-21 ENCOUNTER — Encounter (HOSPITAL_COMMUNITY): Payer: Self-pay | Admitting: Emergency Medicine

## 2023-10-21 ENCOUNTER — Other Ambulatory Visit: Payer: Self-pay

## 2023-10-21 ENCOUNTER — Emergency Department (HOSPITAL_COMMUNITY): Payer: 59

## 2023-10-21 ENCOUNTER — Inpatient Hospital Stay (HOSPITAL_COMMUNITY)
Admission: EM | Admit: 2023-10-21 | Discharge: 2023-10-23 | DRG: 605 | Disposition: A | Discharge status: Home / Self Care | Payer: 59 | Attending: Family Medicine | Admitting: Family Medicine

## 2023-10-21 DIAGNOSIS — L03113 Cellulitis of right upper limb: Secondary | ICD-10-CM | POA: Diagnosis present

## 2023-10-21 DIAGNOSIS — L237 Allergic contact dermatitis due to plants, except food: Secondary | ICD-10-CM | POA: Diagnosis present

## 2023-10-21 DIAGNOSIS — S61459A Open bite of unspecified hand, initial encounter: Secondary | ICD-10-CM | POA: Diagnosis present

## 2023-10-21 DIAGNOSIS — Z79899 Other long term (current) drug therapy: Secondary | ICD-10-CM

## 2023-10-21 DIAGNOSIS — Z884 Allergy status to anesthetic agent status: Secondary | ICD-10-CM

## 2023-10-21 DIAGNOSIS — Z203 Contact with and (suspected) exposure to rabies: Secondary | ICD-10-CM | POA: Diagnosis present

## 2023-10-21 DIAGNOSIS — W5501XA Bitten by cat, initial encounter: Secondary | ICD-10-CM | POA: Diagnosis not present

## 2023-10-21 DIAGNOSIS — Z2914 Encounter for prophylactic rabies immune globin: Secondary | ICD-10-CM

## 2023-10-21 DIAGNOSIS — F17210 Nicotine dependence, cigarettes, uncomplicated: Secondary | ICD-10-CM | POA: Diagnosis present

## 2023-10-21 DIAGNOSIS — Z803 Family history of malignant neoplasm of breast: Secondary | ICD-10-CM

## 2023-10-21 DIAGNOSIS — Z23 Encounter for immunization: Secondary | ICD-10-CM

## 2023-10-21 DIAGNOSIS — S61451A Open bite of right hand, initial encounter: Secondary | ICD-10-CM | POA: Diagnosis not present

## 2023-10-21 DIAGNOSIS — Z90710 Acquired absence of both cervix and uterus: Secondary | ICD-10-CM

## 2023-10-21 DIAGNOSIS — Z87442 Personal history of urinary calculi: Secondary | ICD-10-CM

## 2023-10-21 DIAGNOSIS — Z881 Allergy status to other antibiotic agents status: Secondary | ICD-10-CM

## 2023-10-21 DIAGNOSIS — Z880 Allergy status to penicillin: Secondary | ICD-10-CM

## 2023-10-21 DIAGNOSIS — Z885 Allergy status to narcotic agent status: Secondary | ICD-10-CM

## 2023-10-21 DIAGNOSIS — L089 Local infection of the skin and subcutaneous tissue, unspecified: Principal | ICD-10-CM

## 2023-10-21 DIAGNOSIS — Z91030 Bee allergy status: Secondary | ICD-10-CM

## 2023-10-21 LAB — CBC WITH DIFFERENTIAL/PLATELET
Abs Immature Granulocytes: 0.09 10*3/uL — ABNORMAL HIGH (ref 0.00–0.07)
Basophils Absolute: 0 10*3/uL (ref 0.0–0.1)
Basophils Relative: 0 %
Eosinophils Absolute: 0.2 10*3/uL (ref 0.0–0.5)
Eosinophils Relative: 1 %
HCT: 39 % (ref 36.0–46.0)
Hemoglobin: 13.1 g/dL (ref 12.0–15.0)
Immature Granulocytes: 1 %
Lymphocytes Relative: 12 %
Lymphs Abs: 1.9 10*3/uL (ref 0.7–4.0)
MCH: 31.2 pg (ref 26.0–34.0)
MCHC: 33.6 g/dL (ref 30.0–36.0)
MCV: 92.9 fL (ref 80.0–100.0)
Monocytes Absolute: 0.8 10*3/uL (ref 0.1–1.0)
Monocytes Relative: 5 %
Neutro Abs: 12.3 10*3/uL — ABNORMAL HIGH (ref 1.7–7.7)
Neutrophils Relative %: 81 %
Platelets: 357 10*3/uL (ref 150–400)
RBC: 4.2 MIL/uL (ref 3.87–5.11)
RDW: 13.8 % (ref 11.5–15.5)
WBC: 15.3 10*3/uL — ABNORMAL HIGH (ref 4.0–10.5)
nRBC: 0 % (ref 0.0–0.2)

## 2023-10-21 LAB — BASIC METABOLIC PANEL
Anion gap: 5 (ref 5–15)
BUN: 16 mg/dL (ref 6–20)
CO2: 22 mmol/L (ref 22–32)
Calcium: 8.9 mg/dL (ref 8.9–10.3)
Chloride: 110 mmol/L (ref 98–111)
Creatinine, Ser: 0.52 mg/dL (ref 0.44–1.00)
GFR, Estimated: >60 mL/min (ref >60)
Glucose, Bld: 103 mg/dL — ABNORMAL HIGH (ref 70–99)
Potassium: 3.4 mmol/L — ABNORMAL LOW (ref 3.5–5.1)
Sodium: 137 mmol/L (ref 135–145)

## 2023-10-21 MED ORDER — SODIUM CHLORIDE 0.9 % IV SOLN
1.5000 g | Freq: Once | INTRAVENOUS | Status: AC
  Administered 2023-10-21: 1.5 g via INTRAVENOUS
  Filled 2023-10-21: qty 4

## 2023-10-21 MED ORDER — RABIES IMMUNE GLOBULIN 150 UNIT/ML IM INJ
20.0000 [IU]/kg | INJECTION | Freq: Once | INTRAMUSCULAR | Status: AC
Start: 2023-10-21 — End: 2023-10-21
  Administered 2023-10-21: 1200 [IU] via INTRAMUSCULAR
  Filled 2023-10-21 (×2): qty 8

## 2023-10-21 MED ORDER — OXYCODONE HCL 5 MG PO TABS
5.0000 mg | ORAL_TABLET | ORAL | Status: DC | PRN
  Administered 2023-10-21: 2.5 mg via ORAL
  Administered 2023-10-21 – 2023-10-22 (×2): 5 mg via ORAL
  Administered 2023-10-22: 10 mg via ORAL
  Filled 2023-10-21 (×3): qty 1
  Filled 2023-10-21: qty 2

## 2023-10-21 MED ORDER — ORAL CARE MOUTH RINSE: 15.0000 mL | OROMUCOSAL | Status: DC | PRN

## 2023-10-21 MED ORDER — MORPHINE SULFATE (PF) 4 MG/ML IV SOLN
4.0000 mg | Freq: Once | INTRAVENOUS | Status: AC
  Administered 2023-10-21: 4 mg via INTRAVENOUS
  Filled 2023-10-21: qty 1

## 2023-10-21 MED ORDER — KETOROLAC TROMETHAMINE 15 MG/ML IJ SOLN
7.5000 mg | Freq: Four times a day (QID) | INTRAMUSCULAR | Status: AC
  Administered 2023-10-21 – 2023-10-22 (×5): 7.5 mg via INTRAVENOUS
  Filled 2023-10-21 (×5): qty 1

## 2023-10-21 MED ORDER — ONDANSETRON HCL 4 MG/2ML IJ SOLN
4.0000 mg | Freq: Once | INTRAMUSCULAR | Status: AC
  Administered 2023-10-21: 4 mg via INTRAVENOUS
  Filled 2023-10-21: qty 2

## 2023-10-21 MED ORDER — RABIES VACCINE, PCEC IM SUSR
1.0000 mL | Freq: Once | INTRAMUSCULAR | Status: AC
  Administered 2023-10-21: 1 mL via INTRAMUSCULAR
  Filled 2023-10-21: qty 1

## 2023-10-21 MED ORDER — SODIUM CHLORIDE 0.9 % IV SOLN
3.0000 g | Freq: Four times a day (QID) | INTRAVENOUS | Status: DC
  Administered 2023-10-21 – 2023-10-23 (×9): 3 g via INTRAVENOUS
  Filled 2023-10-21 (×9): qty 8

## 2023-10-21 MED ORDER — POTASSIUM CHLORIDE CRYS ER 20 MEQ PO TBCR
20.0000 meq | EXTENDED_RELEASE_TABLET | Freq: Once | ORAL | Status: AC
  Administered 2023-10-21: 20 meq via ORAL
  Filled 2023-10-21: qty 1

## 2023-10-21 MED ORDER — ONDANSETRON HCL 4 MG/2ML IJ SOLN
4.0000 mg | Freq: Four times a day (QID) | INTRAMUSCULAR | Status: DC | PRN
Start: 2023-10-21 — End: 2023-10-23

## 2023-10-21 NOTE — ED Notes (Signed)
Officer at bedside.

## 2023-10-21 NOTE — ED Notes (Addendum)
 Rabies immune globulin administered around visible cat bites on R hand. Pt tolerated well. The rest of the medication administered in left deltoid and left leg. Rabies vaccine given in the right arm.

## 2023-10-21 NOTE — Progress Notes (Addendum)
 Pharmacy Antibiotic Note  Christine Tate is a 58 y.o. female admitted on 10/21/2023 with  cat bite .  Pharmacy has been consulted for Unasyn dosing.  Plan: Unasyn 3000 mg IV every 6 hours. Monitor labs, c/s, and patient improvement.  Height: 5\' 4"  (162.6 cm) Weight: 59 kg (130 lb) IBW/kg (Calculated) : 54.7  Temp (24hrs), Avg:97.8 F (36.6 C), Min:97.6 F (36.4 C), Max:97.9 F (36.6 C)  Recent Labs  Lab 10/21/23 0552  WBC 15.3*  CREATININE 0.52    Estimated Creatinine Clearance: 66.2 mL/min (by C-G formula based on SCr of 0.52 mg/dL).    Allergies  Allergen Reactions   Penicillins Nausea And Vomiting   Bee Venom Swelling   Hydrocodone Itching   Poison Sumac Extract Swelling    Antimicrobials this admission: Unasyn 2/17>>   Microbiology results: None pending  Thank you for allowing pharmacy to be a part of this patient's care.  Judeth Cornfield, PharmD Clinical Pharmacist 10/21/2023 10:48 AM

## 2023-10-21 NOTE — ED Notes (Signed)
 Pts arm elevated on 2 pillows w/ a ice pack.

## 2023-10-21 NOTE — ED Triage Notes (Signed)
 Pt states her cat bit her on her R hand Sat night and is here this morning for R arm swelling and pain.

## 2023-10-21 NOTE — ED Provider Notes (Addendum)
 Christine Tate Provider Note   CSN: [id number] Arrival date & time: 10/21/23  0510     History  Chief Complaint  Patient presents with   Arm Swelling   Animal Bite    Christine Tate is a 58 y.o. female.  HPI     This is a 57 year old female who presents with right hand swelling and pain.  Reports that she was cleaning her cat on Saturday evening when the cat bit her in the hand.  She states it was very superficial and did not even bleed.  However she has had progressively worsening pain and swelling of the right hand and arm.  No fevers or systemic symptoms.  She is right-hand dominant.  Her cat is not vaccinated; however, she states that she has had to cat since she was a kitten.  She is up-to-date on her tetanus shot as of last year.  Home Medications Prior to Admission medications   Medication Sig Start Date End Date Taking? Authorizing Provider  amoxicillin-clavulanate (AUGMENTIN) 875-125 MG tablet Take 1 tablet by mouth every 12 (twelve) hours. 02/11/23   Triplett, Tammy, PA-C  modafinil (PROVIGIL) 200 MG tablet Take 200 mg by mouth 2 (two) times daily.     [provider]  Na Sulfate-K Sulfate-Mg Sulf (SUPREP BOWEL PREP KIT) 17.5-3.13-1.6 GM/177ML SOLN Take 1 kit by mouth as directed. 05/13/18   Anice Paganini, NP  oxyCODONE-acetaminophen (PERCOCET/ROXICET) 5-325 MG tablet Take 1 tablet by mouth every 6 (six) hours as needed for severe pain. 02/11/23   Triplett, Tammy, PA-C  predniSONE (DELTASONE) 20 MG tablet Take 2 tablets (40 mg total) by mouth daily with breakfast. For the next four days 02/03/22   Gerhard Munch, MD      Allergies    Bee venom, Hydrocodone, and Poison sumac extract    Review of Systems   Review of Systems  Constitutional:  Negative for fever.  Musculoskeletal:        Hand pain and swelling  All other systems reviewed and are negative.   Physical Exam Updated Vital Signs BP (!) 158/88 (BP Location:  Left Arm)   Pulse 78   Temp 97.9 F (36.6 C) (Oral)   Resp 18   Ht 1.626 m (5\' 4" )   Wt 59 kg   SpO2 97%   BMI 22.31 kg/m  Physical Exam Vitals and nursing note reviewed.  Constitutional:      Appearance: She is well-developed. She is not ill-appearing.  HENT:     Head: Normocephalic and atraumatic.  Eyes:     Pupils: Pupils are equal, round, and reactive to light.  Cardiovascular:     Rate and Rhythm: Normal rate and regular rhythm.  Pulmonary:     Effort: Pulmonary effort is normal. No respiratory distress.  Abdominal:     Palpations: Abdomen is soft.  Musculoskeletal:     Cervical back: Neck supple.     Comments: Swelling noted of the right hand that progresses to the mid forearm, no significant erythema, pain with extension of the fingers passively and actively, multiple superficial abrasions noted over the hand  Skin:    General: Skin is warm and dry.     Findings: Erythema: chmdm.  Neurological:     Mental Status: She is alert and oriented to person, place, and time.     ED Results / Procedures / Treatments   Labs (all labs ordered are listed, but only abnormal results are displayed)  Labs Reviewed  CBC WITH DIFFERENTIAL/PLATELET - Abnormal; Notable for the following components:      Result Value   WBC 15.3 (*)    Neutro Abs 12.3 (*)    Abs Immature Granulocytes 0.09 (*)    All other components within normal limits  BASIC METABOLIC PANEL - Abnormal; Notable for the following components:   Potassium 3.4 (*)    Glucose, Bld 103 (*)    All other components within normal limits    EKG None  Radiology DG Hand Complete Right Result Date: 10/21/2023 CLINICAL DATA:  Cat bite.  Hand swelling. EXAM: RIGHT HAND - COMPLETE 3+ VIEW COMPARISON:  None Available. FINDINGS: No evidence for an acute fracture or dislocation. No evidence for radiopaque soft tissue foreign body. Mineralization in the ulnar aspect of the wrist is probably related to chronic carpal injury or  potentially repetitive use injury. Associated calcification noted in the TFCC. Benign lucencies in the lunate and capitate are compatible with intra osseous ganglion or bone cyst. IMPRESSION: 1. No acute bony findings. 2. No evidence for radiopaque soft tissue foreign body. 3. Soft tissue mineralization in the ulnar aspect of the wrist is probably related to chronic posttraumatic injury or potentially repetitive use injury. While not overtly concerning for osteomyelitis, if this corresponds to the region of cat bite, careful correlation for signs/symptoms of bony infection recommended. Electronically Signed   By: Christine Tate M.D.   On: 10/21/2023 06:11    Procedures Procedures    Medications Ordered in ED Medications  ampicillin-sulbactam (UNASYN) 1.5 g in sodium chloride 0.9 % 100 mL IVPB (1.5 g Intravenous New Bag/Given 10/21/23 6578)  morphine (PF) 4 MG/ML injection 4 mg (has no administration in time range)  ondansetron (ZOFRAN) injection 4 mg (has no administration in time range)  rabies immune globulin (HYPERRAB/KEDRAB) injection 1,200 Units (1,200 Units Intramuscular Given 10/21/23 0614)  rabies vaccine (RABAVERT) injection 1 mL (1 mL Intramuscular Given 10/21/23 4696)    ED Course/ Medical Decision Making/ A&P                                 Medical Decision Making Amount and/or Complexity of Data Reviewed Labs: ordered. Radiology: ordered.  Risk Prescription drug management. Decision regarding hospitalization.   This patient presents to the ED for concern of hand pain and swelling, this involves an extensive number of treatment options, and is a complaint that carries with it a high risk of complications and morbidity.  I considered the following differential and admission for this acute, potentially life threatening condition.  The differential diagnosis includes infection, cellulitis, DVT  MDM:    This is a 58 year old female who presents with right hand pain and swelling.   Reports a bite by her cat 12 to 18 hours ago.  She is nontoxic.  Afebrile.  She does have significant swelling of the right hand into the forearm.  She has pain with passive and active extension of the fingers.  She is right-hand dominant.  Patient was given IV Unasyn.  Her tetanus is up-to-date.  We discussed the risk and benefits of rabies.  Given that her animal is not vaccinated, she opted for rabies prophylaxis.  Will likely need admission given her physical exam findings.  Lab work pending.  (Labs, imaging, consults)  Labs: I Ordered, and personally interpreted labs.  The pertinent results include: CBC, BMP  Imaging Studies ordered: I ordered imaging studies including  x-ray hand I independently visualized and interpreted imaging. I agree with the radiologist interpretation  Additional history obtained from chart review.  External records from outside source obtained and reviewed including prior evaluations  Cardiac Monitoring: The patient was not maintained on a cardiac monitor.  If on the cardiac monitor, I personally viewed and interpreted the cardiac monitored which showed an underlying rhythm of: N/A  Reevaluation: After the interventions noted above, I reevaluated the patient and found that they have :stayed the same  Social Determinants of Health:  lives independently  Disposition: Admit  Co morbidities that complicate the patient evaluation  Past Medical History:  Diagnosis Date   History of kidney stones      Medicines Meds ordered this encounter  Medications   rabies immune globulin (HYPERRAB/KEDRAB) injection 1,200 Units   rabies vaccine (RABAVERT) injection 1 mL   ampicillin-sulbactam (UNASYN) 1.5 g in sodium chloride 0.9 % 100 mL IVPB    Antibiotic Indication::   Other Indication (list below)    Other Indication::   wound infection   morphine (PF) 4 MG/ML injection 4 mg   ondansetron (ZOFRAN) injection 4 mg    I have reviewed the patients home medicines  and have made adjustments as needed  Problem List / ED Course: Problem List Items Addressed This Visit   None Visit Diagnoses       Animal bite of right hand with infection, initial encounter    -  Primary   Relevant Medications   rabies immune globulin (HYPERRAB/KEDRAB) injection 1,200 Units (Completed)   rabies vaccine (RABAVERT) injection 1 mL (Completed)                   Final Clinical Impression(s) / ED Diagnoses Final diagnoses:  Animal bite of right hand with infection, initial encounter    Rx / DC Orders ED Discharge Orders     None         Braelyn Jenson, Mayer Masker, MD 10/21/23 1610    Shon Baton, MD 10/21/23 [phone]

## 2023-10-21 NOTE — H&P (Signed)
 History and Physical    Patient: Christine Tate:[id number] DOB: [birth year] DOA: 10/21/2023 DOS: the patient was seen and examined on 10/21/2023 PCP: Avis Epley, PA-C  Patient coming from: Home  Chief Complaint:  Chief Complaint  Patient presents with   Arm Swelling   Animal Bite   HPI: Christine Tate is a 58 y.o. female with medical history significant of tobacco use, seasonal allergies who presented to the ED with right hand and arm swelling after sustaining a bite from her cat. While bathing her indoor cat on the evening of 2/15 she sustained small bites to the right hand which did not seem significant at the time but her hand began swelling overnight and has progressively worsened since that time. She ultimately presented to the ED in the early morning 2/17 for this. The pain was improved with ibuprofen at home. She's had a dog bite in the past for which antibiotics were prescribed to which she was allergic.   she's had no fever, chills, numbness in the hand or weakness, though ROM is severely restricted due to tightness of swelling. In the ED she was given rabies Ig and vaccination series started out of an abundance of caution (her cat is unvaccinated but otherwise acting normally). She is UTD on tetanus. XR showed no evidence of deeper infection. VSS, WBC 15.3k. Unasyn given and admission requested for observation on IV antibiotics.   Review of Systems: As mentioned in the history of present illness. All other systems reviewed and are negative. Past Medical History:  Diagnosis Date   History of kidney stones    Past Surgical History:  Procedure Laterality Date   ABDOMINAL HYSTERECTOMY     ANTERIOR CRUCIATE LIGAMENT REPAIR     COLONOSCOPY N/A 06/25/2018   Procedure: COLONOSCOPY;  Surgeon: Corbin Ade, MD;  Location: [facility];  Service: Endoscopy;  Laterality: N/A;  9:45   CYSTECTOMY     POLYPECTOMY  06/25/2018   Procedure: POLYPECTOMY;  Surgeon: Corbin Ade,  MD;  Location: [facility];  Service: Endoscopy;;   TUBAL LIGATION     Social History:  reports that she has been smoking. She has never used smokeless tobacco. She reports current alcohol use. She reports that she does not use drugs.  Allergies  Allergen Reactions   Penicillins Nausea And Vomiting   Bee Venom Swelling   Hydrocodone Itching   Poison Sumac Extract Swelling    Family History  Problem Relation Age of Onset   Heart Problems Mother    Breast cancer Sister     Prior to Admission medications   Medication Sig Start Date End Date Taking? Authorizing Provider  ibuprofen (ADVIL) 200 MG tablet Take 200 mg by mouth every 6 (six) hours as needed for mild pain (pain score 1-3) or moderate pain (pain score 4-6).   Yes [provider]  loratadine (CLARITIN) 10 MG tablet Take 10 mg by mouth daily.   Yes [provider]    Physical Exam: Vitals:   10/21/23 0527 10/21/23 0720 10/21/23 0800 10/21/23 0930  BP: (!) 158/88 113/80 129/78 114/80  Pulse: 78 73 73 75  Resp: 18 15 16 16   Temp: 97.9 F (36.6 C) 97.6 F (36.4 C)    TempSrc: Oral Oral    SpO2: 97% 93% 93% 96%  Weight:      Height:      Gen: No distress Pulm: Clear, nonlabored  CV: RRR, no MRG or edema GI: Soft, NT, ND, +  BS  Neuro: Alert and oriented. No new focal deficits. Ext: Warm, dry. Right hand with dorsal edema extending to mid-forearm, tender. No fluctuance noted. There are very shallow puncta in the dorsal hand and scratches more proximally, no purulence or hemorrhage. There is minimal erythema. Digital extension/flexion is present but severely limited actively, reported pain passively  Skin: No other rashes, lesions or ulcers on visualized skin   Data Reviewed: WBC 15.3k, K 3.4, glucose 103, no hx DM.  Right hand XR: No acute bony findings or foreign body.  Soft tissue mineralization in the ulnar aspect of the wrist is probably related to chronic posttraumatic injury or  potentially repetitive use injury. While not overtly concerning for osteomyelitis, if this corresponds to the region of cat bite, careful correlation for signs/symptoms of bony infection recommended.  Assessment and Plan: Cat bite of right (dominant) hand/wrist with progressive swelling: No evidence of compartment syndrome of the upper extremity at this time.  - Continue observation of edema/monitoring neurovascular status while initiating unasyn abx. Pt reports hx abx intolerance, thinks it was penicillin, but has tolerated first dose of unasyn without issue thus far.  - Monitor leukocytosis. Pt is nontoxic, afebrile, not septic.  - No open/draining wound to culture at this time.  - If improving, can DC home with conversion to oral antibiotics in next 24 hours. If not improving, would need cross sectional imaging and orthopedics input.  - No known risk factors for treatment failure.  - Analgesics including NSAID scheduled, oxycodone prn; also continue prn ondansetron.    Advance Care Planning: Full code  Consults: None  Family Communication: None  Severity of Illness: The appropriate patient status for this patient is OBSERVATION. Observation status is judged to be reasonable and necessary in order to provide the required intensity of service to ensure the patient's safety. The patient's presenting symptoms, physical exam findings, and initial radiographic and laboratory data in the context of their medical condition is felt to place them at decreased risk for further clinical deterioration. Furthermore, it is anticipated that the patient will be medically stable for discharge from the hospital within 2 midnights of admission.   Author: Tyrone Nine, MD 10/21/2023 10:28 AM  For on call review [email or web address].

## 2023-10-22 ENCOUNTER — Observation Stay (HOSPITAL_COMMUNITY): Payer: 59

## 2023-10-22 DIAGNOSIS — L03113 Cellulitis of right upper limb: Secondary | ICD-10-CM | POA: Diagnosis present

## 2023-10-22 DIAGNOSIS — Z884 Allergy status to anesthetic agent status: Secondary | ICD-10-CM | POA: Diagnosis not present

## 2023-10-22 DIAGNOSIS — Z90710 Acquired absence of both cervix and uterus: Secondary | ICD-10-CM | POA: Diagnosis not present

## 2023-10-22 DIAGNOSIS — Z87442 Personal history of urinary calculi: Secondary | ICD-10-CM | POA: Diagnosis not present

## 2023-10-22 DIAGNOSIS — Z803 Family history of malignant neoplasm of breast: Secondary | ICD-10-CM | POA: Diagnosis not present

## 2023-10-22 DIAGNOSIS — Z203 Contact with and (suspected) exposure to rabies: Secondary | ICD-10-CM | POA: Diagnosis present

## 2023-10-22 DIAGNOSIS — Z91030 Bee allergy status: Secondary | ICD-10-CM | POA: Diagnosis not present

## 2023-10-22 DIAGNOSIS — S61451A Open bite of right hand, initial encounter: Secondary | ICD-10-CM | POA: Diagnosis present

## 2023-10-22 DIAGNOSIS — Z79899 Other long term (current) drug therapy: Secondary | ICD-10-CM | POA: Diagnosis not present

## 2023-10-22 DIAGNOSIS — Z881 Allergy status to other antibiotic agents status: Secondary | ICD-10-CM | POA: Diagnosis not present

## 2023-10-22 DIAGNOSIS — S61459A Open bite of unspecified hand, initial encounter: Secondary | ICD-10-CM | POA: Diagnosis present

## 2023-10-22 DIAGNOSIS — Z23 Encounter for immunization: Secondary | ICD-10-CM | POA: Diagnosis not present

## 2023-10-22 DIAGNOSIS — L237 Allergic contact dermatitis due to plants, except food: Secondary | ICD-10-CM | POA: Diagnosis present

## 2023-10-22 DIAGNOSIS — Z2914 Encounter for prophylactic rabies immune globin: Secondary | ICD-10-CM | POA: Diagnosis not present

## 2023-10-22 DIAGNOSIS — Z885 Allergy status to narcotic agent status: Secondary | ICD-10-CM | POA: Diagnosis not present

## 2023-10-22 DIAGNOSIS — W5501XA Bitten by cat, initial encounter: Secondary | ICD-10-CM | POA: Diagnosis not present

## 2023-10-22 DIAGNOSIS — F17210 Nicotine dependence, cigarettes, uncomplicated: Secondary | ICD-10-CM | POA: Diagnosis present

## 2023-10-22 DIAGNOSIS — Z880 Allergy status to penicillin: Secondary | ICD-10-CM | POA: Diagnosis not present

## 2023-10-22 LAB — CBC
HCT: 37.5 % (ref 36.0–46.0)
Hemoglobin: 12.1 g/dL (ref 12.0–15.0)
MCH: 30.3 pg (ref 26.0–34.0)
MCHC: 32.3 g/dL (ref 30.0–36.0)
MCV: 94 fL (ref 80.0–100.0)
Platelets: 328 10*3/uL (ref 150–400)
RBC: 3.99 MIL/uL (ref 3.87–5.11)
RDW: 13.7 % (ref 11.5–15.5)
WBC: 10 10*3/uL (ref 4.0–10.5)
nRBC: 0 % (ref 0.0–0.2)

## 2023-10-22 LAB — BASIC METABOLIC PANEL
Anion gap: 10 (ref 5–15)
BUN: 18 mg/dL (ref 6–20)
CO2: 20 mmol/L — ABNORMAL LOW (ref 22–32)
Calcium: 8.8 mg/dL — ABNORMAL LOW (ref 8.9–10.3)
Chloride: 108 mmol/L (ref 98–111)
Creatinine, Ser: 0.53 mg/dL (ref 0.44–1.00)
GFR, Estimated: >60 mL/min (ref >60)
Glucose, Bld: 81 mg/dL (ref 70–99)
Potassium: 3.4 mmol/L — ABNORMAL LOW (ref 3.5–5.1)
Sodium: 138 mmol/L (ref 135–145)

## 2023-10-22 MED ORDER — GADOBUTROL 1 MMOL/ML IV SOLN
6.0000 mL | Freq: Once | INTRAVENOUS | Status: AC | PRN
  Administered 2023-10-22: 6 mL via INTRAVENOUS

## 2023-10-22 MED ORDER — GADOBUTROL 1 MMOL/ML IV SOLN: 6.0000 mL | Freq: Once | INTRAVENOUS | Status: DC | PRN

## 2023-10-22 NOTE — Consult Note (Signed)
 ORTHOPAEDIC CONSULTATION  REQUESTING PHYSICIAN: Tyrone Nine, MD  ASSESSMENT AND PLAN: 58 y.o. female with the following: Cat bite, right hand  Patient has a diffuse swelling, and superficial abrasions, associated with the recent cat bite.  No blood was noted at the time of the bite.  On exam, there is no redness.  There is no fluctuance.  Upon my review of the MRI, there is some subcutaneous fluid, more consistent with cellulitis.  No fluid collections.  As a result, I do not think that any intervention is warranted at this time.  Because the bite marks are more superficial, more direct treatment for skin flora may be appropriate.  Elevate the hand to help with swelling.  Apply ice to help with swelling.  Continue to work on range of motion.  I would also recommend repeating labs, including ESR and CRP.  - Weight Bearing Status/Activity: Weightbearing as tolerated  - Additional recommended labs/tests: Repeat CBC, as well as ESR and CRP  -VTE Prophylaxis: As needed  - Pain control: As needed; elevate to help with swelling and pain control  - Follow-up plan: To be determined.  Will continue to monitor.  -Procedures: None planned  Chief Complaint: Right hand pain  HPI: Christine Tate is a 58 y.o. female with past medical history as listed below, who presented to the emergency department for evaluation following a cat bite.  It is her own cat.  This happened a few days ago.  At the time of the bite, there was no blood.  After the bite, and within the subsequent 36 hours, she started to have worsening pain and swelling.  She has not had any fevers.  She presented to the emergency department.  She was admitted, and antibiotics were initiated.  She continues to have pain.  She has not had a fever while in the hospital.  She has been taking Unasyn.  Past Medical History:  Diagnosis Date   History of kidney stones    Past Surgical History:  Procedure Laterality Date   ABDOMINAL  HYSTERECTOMY     ANTERIOR CRUCIATE LIGAMENT REPAIR     COLONOSCOPY N/A 06/25/2018   Procedure: COLONOSCOPY;  Surgeon: Corbin Ade, MD;  [facility]: [facility];  Service: Endoscopy;  Laterality: N/A;  9:45   CYSTECTOMY     POLYPECTOMY  06/25/2018   Procedure: POLYPECTOMY;  Surgeon: Corbin Ade, MD;  Location: [facility];  Service: Endoscopy;;   TUBAL LIGATION     Social History   Socioeconomic History   Marital status: Married    Spouse name: Not on file   Number of children: Not on file   Years of education: Not on file   Highest education level: Not on file  Occupational History   Not on file  Tobacco Use   Smoking status: Every Day    Current packs/day: 1.00    Types: Cigarettes   Smokeless tobacco: Never  Vaping Use   Vaping status: Never Used  Substance and Sexual Activity   Alcohol use: Yes    Comment: occationally   Drug use: No   Sexual activity: Yes  Other Topics Concern   Not on file  Social History Narrative   Not on file   Social Drivers of Health   Financial Resource Strain: Not on file  Food Insecurity: No Food Insecurity (10/21/2023)   Hunger Vital Sign    Worried About Running Out of Food in the Last Year: Never true  Ran Out of Food in the Last Year: Never true  Transportation Needs: No Transportation Needs (10/21/2023)   PRAPARE - Administrator, Civil Service (Medical): No    Lack of Transportation (Non-Medical): No  Physical Activity: Not on file  Stress: Not on file  Social Connections: Not on file   Family History  Problem Relation Age of Onset   Heart Problems Mother    Breast cancer Sister    Allergies  Allergen Reactions   Penicillins Nausea And Vomiting    Patient tolerated Unasyn    Bee Venom Swelling   Hydrocodone Itching   Poison Sumac Extract Swelling   Prior to Admission medications   Medication Sig Start Date End Date Taking? Authorizing Provider  ibuprofen (ADVIL) 200 MG tablet Take 200 mg by  mouth every 6 (six) hours as needed for mild pain (pain score 1-3) or moderate pain (pain score 4-6).   Yes [provider]  loratadine (CLARITIN) 10 MG tablet Take 10 mg by mouth daily.   Yes [provider]   DG Hand Complete Right Result Date: 10/21/2023 CLINICAL DATA:  Cat bite.  Hand swelling. EXAM: RIGHT HAND - COMPLETE 3+ VIEW COMPARISON:  None Available. FINDINGS: No evidence for an acute fracture or dislocation. No evidence for radiopaque soft tissue foreign body. Mineralization in the ulnar aspect of the wrist is probably related to chronic carpal injury or potentially repetitive use injury. Associated calcification noted in the TFCC. Benign lucencies in the lunate and capitate are compatible with intra osseous ganglion or bone cyst. IMPRESSION: 1. No acute bony findings. 2. No evidence for radiopaque soft tissue foreign body. 3. Soft tissue mineralization in the ulnar aspect of the wrist is probably related to chronic posttraumatic injury or potentially repetitive use injury. While not overtly concerning for osteomyelitis, if this corresponds to the region of cat bite, careful correlation for signs/symptoms of bony infection recommended. Electronically Signed   By: Kennith Center M.D.   On: 10/21/2023 06:11   Family History Reviewed and non-contributory, no pertinent history of problems with bleeding or anesthesia    Review of Systems No fevers or chills No numbness or tingling No chest pain No shortness of breath No bowel or bladder dysfunction No GI distress No headaches    OBJECTIVE  Vitals:Patient Vitals for the past 8 hrs:  BP Temp Temp src Pulse Resp SpO2  10/22/23 0934 122/73 98.4 F (36.9 C) Oral 72 18 96 %   General: Alert, no acute distress Cardiovascular: Warm extremities noted Respiratory: No cyanosis, no use of accessory musculature GI: No organomegaly, abdomen is soft and non-tender Skin: No lesions in the area of chief complaint other than  those listed below in MSK exam.  Neurologic: Sensation intact distally save for the below mentioned MSK exam Psychiatric: Patient is competent for consent with normal mood and affect Lymphatic: No swelling obvious and reported other than the area involved in the exam below Extremities  RUE:       Test Results Imaging X-rays are negative  MRI results have not been finalized.  There does not appear to be a loculated fluid collection.  Some cellulitis.  Labs cbc Recent Labs    10/21/23 0552 10/22/23 1201  WBC 15.3* 10.0  HGB 13.1 12.1  HCT 39.0 37.5  PLT 357 328    Recent Labs    10/21/23 0552  NA 137  K 3.4*  CL 110  CO2 22  GLUCOSE 103*  BUN 16  CREATININE 0.52  CALCIUM 8.9

## 2023-10-22 NOTE — Progress Notes (Signed)
 Transition of Care Department [facility]) has reviewed patient and no other TOC needs have been identified at this time. We will continue to monitor patient advancement through interdisciplinary progression rounds. If new patient transition needs arise, please place a TOC consult.   10/22/23 0743  TOC Brief Assessment  Insurance and Status Reviewed  Patient has primary care physician Yes  Home environment has been reviewed Lives with brother.  Prior level of function: Independent.  Prior/Current Home Services No current home services  Social Drivers of Health Review SDOH reviewed no interventions necessary  Readmission risk has been reviewed Yes  Transition of care needs no transition of care needs at this time

## 2023-10-22 NOTE — Plan of Care (Signed)

## 2023-10-22 NOTE — Progress Notes (Signed)
 [facility] PROGRESS NOTE  Christine Tate (DOB: 1965-10-13) JXB:[id number] PCP: Avis Epley, PA-C  Brief Narrative: Christine Tate is a 58 y.o. female with a history of tobacco use who presented to the ED early in the morning on 10/21/2023 with worsening painful swelling of the right hand after sustaining a cat bite while bathing her indoor-outdoor non-vaccinated cat. She was afebrile, nontoxic-appearing with leukocytosis (WBC 15.3k). XR showed no acute osseous abnormality. She is UTD on tetanus, but opted to get rabies Ig and vaccination series started. Unasyn was started and patient admitted, limited improvement thus far so orthopedics is consulted, MRI ordered.  Subjective: Swelling in right hand extending to forearm is essentially unchanged, no worse, no better. No fevers, does not feel ill. Her cat is with animal control currently.  Objective: BP 122/73 (BP Location: Left Arm)   Pulse 72   Temp 98.4 F (36.9 C) (Oral)   Resp 18   Ht 5\' 4"  (1.626 m)   Wt 60.5 kg   SpO2 96%   BMI 22.89 kg/m   Gen: No distress Pulm: Nonlabored   Ext/skin: Diffuse significant edema involving right hand/digits extending to mid forearm with mild pink erythema. Puncta where rabies Ig was administered have developed into small pustules. Abrasions are stable without exudate. Hand/digits have intact sensation. Strength testing significantly limited by edema/pain. Pain with passive extension/flexion is stable.   Assessment & Plan: Cat bite of right (dominant) hand/wrist with progressive swelling/cellulitis: No evidence of compartment syndrome of the upper extremity at this time.  - Continue unasyn for now. Remains afebrile, nontoxic. No significant change to exam yet. No known risk factors for treatment failure.  - Orthopedics, Dr. Dallas Schimke consulted, will see this afternoon.  - MRI ordered.  - Analgesics including NSAID scheduled, oxycodone prn; also continue prn ondansetron.  - s/p rabies  immunoglobulin and rabies vaccine on 2/17, will continue vaccination series on days 3, 7, 14 (whether as inpatient or after discharge she can go to [facility] urgent care centers for this).   Tyrone Nine, MD [facility] [email or web address] 10/22/2023, 10:14 AM

## 2023-10-23 DIAGNOSIS — W5501XA Bitten by cat, initial encounter: Secondary | ICD-10-CM | POA: Diagnosis not present

## 2023-10-23 DIAGNOSIS — S61451A Open bite of right hand, initial encounter: Secondary | ICD-10-CM | POA: Diagnosis not present

## 2023-10-23 MED ORDER — AMOXICILLIN-POT CLAVULANATE 875-125 MG PO TABS: 1.0000 | ORAL_TABLET | Freq: Two times a day (BID) | ORAL | 0 refills | Status: AC

## 2023-10-23 MED ORDER — ACETAMINOPHEN 325 MG PO TABS: 650.0000 mg | ORAL_TABLET | ORAL | 2 refills | Status: AC | PRN

## 2023-10-23 NOTE — Discharge Summary (Signed)
 Christine Tate, is a 58 y.o. female  DOB [birth year]  MRN [id number].  Admission date:  10/21/2023  Admitting Physician  Tyrone Nine, MD  Discharge Date:  10/23/2023   Primary MD  Avis Epley, PA-C  Recommendations for primary care physician for things to follow:  1)Keep right upper extremity elevated to keep swelling down--- continue range of motion exercises for your right hand and wrist as discussed 2)Take Augmentin (amoxicillin clavulanic acid) as prescribed--you may take over-the-counter probiotic while taking this antibiotic 3)follow up with Avis Epley, PA-C (Primary Care Provider) as needed especially if right hand and wrist swelling gets worse------or if Redness, tenderness, or signs of infection (pain, swelling, redness, odor or green/yellow discharge around incision site 4) please call also if animal control informed you of any rabies like behavior of your cat as you may need rabies vaccination in that case  Admission Diagnosis  Cat bite of hand, right, initial encounter [S61.451A, W55.01XA] Animal bite of right hand with infection, initial encounter [S61.451A, L08.9] Cat bite of hand [S61.459A, W55.01XA]   Discharge Diagnosis  Cat bite of hand, right, initial encounter [N02.725D, W55.01XA] Animal bite of right hand with infection, initial encounter [S61.451A, L08.9] Cat bite of hand [S61.459A, W55.01XA]    Principal Problem:   Cat bite of hand, right, initial encounter Active Problems:   Cat bite of hand      Past Medical History:  Diagnosis Date   History of kidney stones     Past Surgical History:  Procedure Laterality Date   ABDOMINAL HYSTERECTOMY     ANTERIOR CRUCIATE LIGAMENT REPAIR     COLONOSCOPY N/A 06/25/2018   Procedure: COLONOSCOPY;  Surgeon: Corbin Ade, MD;  Location: [facility];  Service: Endoscopy;  Laterality: N/A;  9:45   CYSTECTOMY     POLYPECTOMY   06/25/2018   Procedure: POLYPECTOMY;  Surgeon: Corbin Ade, MD;  Location: [facility];  Service: Endoscopy;;   TUBAL LIGATION       HPI  from the history and physical done on the day of admission:   HPI: Christine Tate is a 58 y.o. female with medical history significant of tobacco use, seasonal allergies who presented to the ED with right hand and arm swelling after sustaining a bite from her cat. While bathing her indoor cat on the evening of 2/15 she sustained small bites to the right hand which did not seem significant at the time but her hand began swelling overnight and has progressively worsened since that time. She ultimately presented to the ED in the early morning 2/17 for this. The pain was improved with ibuprofen at home. She's had a dog bite in the past for which antibiotics were prescribed to which she was allergic.    she's had no fever, chills, numbness in the hand or weakness, though ROM is severely restricted due to tightness of swelling. In the ED she was given rabies Ig and vaccination series started out of an abundance of caution (her cat is unvaccinated  but otherwise acting normally). She is UTD on tetanus. XR showed no evidence of deeper infection. VSS, WBC 15.3k. Unasyn given and admission requested for observation on IV antibiotics.    Review of Systems: As mentioned in the history of present illness. All other systems reviewed and are negative.   Hospital Course:    Assessment   1)Cat bite of right (dominant) hand/wrist with progressive swelling/cellulitis: No evidence of compartment syndrome of the upper extremity at this time.  - Orthopedics, Dr. Dallas Schimke consulted--he reviewed the MRI and consulted with patient and recommends no operative intervention -- s/p rabies immunoglobulin and rabies vaccine on 10/21/23--- patient has had the cat for at least 2 years, cat bite was on 10/19/2023, animal control has had cat on the quarantine since 10/21/2023 -Patient does not  think her cat is exhibiting any unusual behavior -Okay to pause further rabies vaccination at this time unless the cat starts to exhibit worrisome behavior--Cat is currently being monitored/quarantined by animal control -Patient reports that her tetanus vaccination is up-to-date -Clinically much improved with IV Unasyn, okay to discharge on p.o. Augmentin--- if GI intolerance to p.o. Augmentin will switch to doxycycline to cover for Pasteurella multocida -WBC 15.3 >>10.0  Discharge Condition: Stable  Follow UP-PCP as needed   Consults obtained -orthopedics  Diet and Activity recommendation:  As advised  Discharge Instructions    Discharge Instructions     Call MD for:  difficulty breathing, headache or visual disturbances   Complete by: As directed    Call MD for:  persistant dizziness or light-headedness   Complete by: As directed    Call MD for:  persistant nausea and vomiting   Complete by: As directed    Call MD for:  redness, tenderness, or signs of infection (pain, swelling, redness, odor or green/yellow discharge around incision site)   Complete by: As directed    Call MD for:  severe uncontrolled pain   Complete by: As directed    Call MD for:  temperature >100.4   Complete by: As directed    Diet general   Complete by: As directed    Discharge instructions   Complete by: As directed    1)Keep right upper extremity elevated to keep swelling down--- continue range of motion exercises for your right hand and wrist as discussed 2)Take Augmentin (amoxicillin clavulanic acid) as prescribed--you may take over-the-counter probiotic while taking this antibiotic 3)follow up with Avis Epley, PA-C (Primary Care Provider) as needed especially if right hand and wrist swelling gets worse------or if Redness, tenderness, or signs of infection (pain, swelling, redness, odor or green/yellow discharge around incision site 4) please call also if animal control informed you of any  rabies like behavior of your cat as you may need rabies vaccination in that case   Increase activity slowly   Complete by: As directed          Discharge Medications     Allergies as of 10/23/2023       Reactions   Penicillins Nausea And Vomiting   Patient tolerated Unasyn    Bee Venom Swelling   Hydrocodone Itching   Poison Sumac Extract Swelling        Medication List     TAKE these medications    acetaminophen 325 MG tablet Commonly known as: Tylenol Take 2 tablets (650 mg total) by mouth every 4 (four) hours as needed.   amoxicillin-clavulanate 875-125 MG tablet Commonly known as: AUGMENTIN Take 1 tablet by mouth 2 (two)  times daily for 7 days.   ibuprofen 200 MG tablet Commonly known as: ADVIL Take 200 mg by mouth every 6 (six) hours as needed for mild pain (pain score 1-3) or moderate pain (pain score 4-6).   loratadine 10 MG tablet Commonly known as: CLARITIN Take 10 mg by mouth daily.        Major procedures and Radiology Reports - PLEASE review detailed and final reports for all details, in brief -   MR HAND RIGHT W WO CONTRAST Result Date: 10/22/2023 CLINICAL DATA:  Multiple cat bites to right hand.  Swelling. EXAM: MRI OF THE RIGHT HAND WITHOUT AND WITH CONTRAST TECHNIQUE: Multiplanar, multisequence MR imaging of the right hand was performed before and after the administration of intravenous contrast. CONTRAST:  6mL GADAVIST GADOBUTROL 1 MMOL/ML IV SOLN COMPARISON:  Radiographs 10/21/2023 and 01/05/2013 FINDINGS: Bones/Joint/Cartilage No evidence of acute fracture, dislocation or osteomyelitis within the fingers or metacarpals. As seen on the recent radiographs, there are prominent intraosseous cysts within the capitate and lunate, new compared with remote radiographs from 2014. These are incompletely visualized on this examination of the hand. There is some associated surrounding marrow edema and enhancement. No cortical destruction identified. Small  radiocarpal and midcarpal joint effusions are noted with associated synovial enhancement. No significant effusions are seen in the hand. Ligaments The collateral ligaments of the metacarpal phalangeal joints appear intact. Muscles and Tendons The flexor and extensor tendons are intact. There is a small amount of extensor digitorum longus tenosynovitis in enhancement at the level of the distal carpal row. No evidence of flexor tenosynovitis. No focal muscular abnormalities are identified. Soft tissues Diffuse subcutaneous edema and heterogeneous enhancement throughout the dorsal aspect of the hand. Probable small puncture wound dorsal to the extensor digitorum longus tendons, best seen on coronal image 3/15. No focal fluid collection or definite foreign body seen in this area. There is some heterogeneous enhancement in the ulnar soft tissues of the wrist which may correspond with the soft tissue calcifications seen on recent radiographs. IMPRESSION: 1. Diffuse subcutaneous edema and heterogeneous enhancement throughout the dorsal aspect of the hand consistent with cellulitis. Probable small puncture wound dorsal to the extensor digitorum longus tendons. No focal fluid collection or definite foreign body seen in this area. 2. Small amount of extensor digitorum longus tenosynovitis at the level of the distal carpal row, potentially infectious. 3. No evidence of osteomyelitis or septic arthritis in the hand. 4. Incompletely visualized prominent intraosseous cysts within the capitate and lunate with some surrounding marrow edema and enhancement and synovitis. These are nonspecific and suggest underlying inflammatory or crystalline arthropathy. Cannot exclude septic arthritis at the wrist. Consider further evaluation with dedicated MRI of the wrist. Electronically Signed   By: Carey Bullocks M.D.   On: 10/22/2023 15:44   DG Hand Complete Right Result Date: 10/21/2023 CLINICAL DATA:  Cat bite.  Hand swelling. EXAM:  RIGHT HAND - COMPLETE 3+ VIEW COMPARISON:  None Available. FINDINGS: No evidence for an acute fracture or dislocation. No evidence for radiopaque soft tissue foreign body. Mineralization in the ulnar aspect of the wrist is probably related to chronic carpal injury or potentially repetitive use injury. Associated calcification noted in the TFCC. Benign lucencies in the lunate and capitate are compatible with intra osseous ganglion or bone cyst. IMPRESSION: 1. No acute bony findings. 2. No evidence for radiopaque soft tissue foreign body. 3. Soft tissue mineralization in the ulnar aspect of the wrist is probably related to chronic posttraumatic injury or  potentially repetitive use injury. While not overtly concerning for osteomyelitis, if this corresponds to the region of cat bite, careful correlation for signs/symptoms of bony infection recommended. Electronically Signed   By: Kennith Center M.D.   On: 10/21/2023 06:11    Today   Subjective    Christine Tate today has no new complaints -Right hand pain and swelling improving -Right hand range of motion improving     No fever  Or chills       Patient has been seen and examined prior to discharge   Objective   Blood pressure 136/83, pulse 74, temperature 98.1 F (36.7 C), temperature source Oral, resp. rate 20, height 5\' 4"  (1.626 m), weight 60.5 kg, SpO2 95%.   Exam Gen:- Awake Alert, no acute distress  HEENT:- .AT, No sclera icterus Neck-Supple Neck,No JVD,.  Lungs-  CTAB , good air movement bilaterally CV- S1, S2 normal, regular Abd-  +ve B.Sounds, Abd Soft, No tenderness,    Psych-affect is appropriate, oriented x3 Neuro-no new focal deficits, no tremors  MSK-improving right hand/wrist area swelling and range of motion -Good capillary refill good radial pulse -No concerns about neurovascular compromise -No significant erythema or streaking, no drainage   Media Information  Document Information  Photos  Improved Rt hand /wrist  swelling  10/23/2023 14:09  Attached To:  Hospital Encounter on 10/21/23  Source Information  Shon Hale, MD  [facility]  Document History      Data Review   CBC w Diff:  Lab Results  Component Value Date   WBC 10.0 10/22/2023   HGB 12.1 10/22/2023   HCT 37.5 10/22/2023   PLT 328 10/22/2023   LYMPHOPCT 12 10/21/2023   MONOPCT 5 10/21/2023   EOSPCT 1 10/21/2023   BASOPCT 0 10/21/2023    CMP:  Lab Results  Component Value Date   NA 138 10/22/2023   K 3.4 (L) 10/22/2023   CL 108 10/22/2023   CO2 20 (L) 10/22/2023   BUN 18 10/22/2023   CREATININE 0.53 10/22/2023   PROT 7.1 02/03/2022   ALBUMIN 4.1 02/03/2022   BILITOT 0.4 02/03/2022   ALKPHOS 123 02/03/2022   AST 39 02/03/2022   ALT 85 (H) 02/03/2022  .  Total Discharge time is about 33 minutes  Shon Hale M.D on 10/23/2023 at 2:27 PM  Go to [email or web address] -  for contact info  [facility] - Office  [phone]

## 2023-10-23 NOTE — Discharge Instructions (Signed)
 1)Keep right upper extremity elevated to keep swelling down--- continue range of motion exercises for your right hand and wrist as discussed 2)Take Augmentin (amoxicillin clavulanic acid) as prescribed--you may take over-the-counter probiotic while taking this antibiotic 3)follow up with Avis Epley, PA-C (Primary Care Provider) as needed especially if right hand and wrist swelling gets worse------or if Redness, tenderness, or signs of infection (pain, swelling, redness, odor or green/yellow discharge around incision site 4) please call also if animal control informed you of any rabies like behavior of your cat as you may need rabies vaccination in that case
